# Patient Record
Sex: Female | Born: 1990 | Race: White | Hispanic: No | Marital: Single | Smoking: Never smoker
Health system: Southern US, Community
[De-identification: ages and names within clinical notes are randomized; demographics above are authoritative.]

## PROBLEM LIST (undated history)

## (undated) ENCOUNTER — Emergency Department (HOSPITAL_COMMUNITY): Admission: EM | Payer: Self-pay | Source: Home / Self Care

## (undated) DIAGNOSIS — F419 Anxiety disorder, unspecified: Secondary | ICD-10-CM

## (undated) DIAGNOSIS — F410 Panic disorder [episodic paroxysmal anxiety] without agoraphobia: Secondary | ICD-10-CM

## (undated) HISTORY — PX: WISDOM TOOTH EXTRACTION: SHX21

## (undated) HISTORY — PX: TONSILLECTOMY: SUR1361

---
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
## 2008-07-18 ENCOUNTER — Other Ambulatory Visit: Admission: RE | Admit: 2008-07-18 | Discharge: 2008-07-18 | Payer: Self-pay | Admitting: Family Medicine

## 2009-10-19 ENCOUNTER — Other Ambulatory Visit: Admission: RE | Admit: 2009-10-19 | Discharge: 2009-10-19 | Payer: Self-pay | Admitting: Family Medicine

## 2009-11-01 ENCOUNTER — Emergency Department (HOSPITAL_COMMUNITY): Admission: EM | Admit: 2009-11-01 | Discharge: 2009-11-01 | Payer: Self-pay | Admitting: Emergency Medicine

## 2010-08-05 LAB — URINALYSIS, ROUTINE W REFLEX MICROSCOPIC
Glucose, UA: NEGATIVE mg/dL
Nitrite: NEGATIVE
Protein, ur: NEGATIVE mg/dL
Urobilinogen, UA: 0.2 mg/dL (ref 0.0–1.0)
pH: 5.5 (ref 5.0–8.0)

## 2010-08-05 LAB — BASIC METABOLIC PANEL

## 2010-08-05 LAB — URINE MICROSCOPIC-ADD ON

## 2010-08-05 LAB — POCT PREGNANCY, URINE: Preg Test, Ur: NEGATIVE

## 2010-10-02 NOTE — Consult Note (Signed)
NAMECAROLIN, Wendy [name]               ACCOUNT NO.:  [id number]   MEDICAL RECORD NO.:  [id number]           PATIENT TYPE:   LOCATION:                                 FACILITY:   PHYSICIAN:  Kristine Garbe. Ezzard Standing, M.D.DATE OF BIRTH:  06/22/1990   DATE OF CONSULTATION:  11/27/2007  DATE OF DISCHARGE:                                 CONSULTATION   REASON FOR CONSULT:  Evaluate the patient with right peritonsillar  abscess.   BRIEF HISTORY:  Wendy [name] is a 20 year old female who has had  history of tonsil problems in the past.  More recently, has had positive  Strep test, was treated with clarithromycin as she is allergic to  penicillin.  She had increasing pain especially on the right side,  presents back to emergency room with what appears to be a right  peritonsillar abscess.  She had some difficulty with p.o. intake and  severe pain in the right side of her neck and throat.  On examination,  she has fairly obvious right peritonsillar abscess with diffuse swelling  of the right peritonsillar area.   PROCEDURE:  The throat was sprayed with topical Cetacaine.  The right  peritonsillar area was injected with 4 mL of Xylocaine with epinephrine  for local anesthetic.  An 18-gauge needle was utilized to aspirate the  abscess, and this was sent for culture.  A 15 blade was then used to  open up the abscess area which was enlarged with hemostat and drained.   IMPRESSION:  Right peritonsillar abscess and history of tonsil  infections.   RECOMMENDATIONS:  Incision and drainage of peritonsillar abscess was  performed in the emergency room.  Placed the patient on antibiotic  Cleocin 300 mg t.i.d. for 1 week.  We will have her follow up in the  office next week to review and discuss possible tonsillectomy.  Note, the patient is allergic to PENICILLIN.           ______________________________  Kristine Garbe. Ezzard Standing, M.D.     CEN/MEDQ  D:  11/27/2007  T:  11/28/2007  Job:  [id number]

## 2010-11-13 ENCOUNTER — Other Ambulatory Visit: Payer: Self-pay | Admitting: Physician Assistant

## 2010-11-13 ENCOUNTER — Other Ambulatory Visit (HOSPITAL_COMMUNITY)
Admission: RE | Admit: 2010-11-13 | Discharge: 2010-11-13 | Disposition: A | Discharge status: Home / Self Care | Payer: 59 | Source: Ambulatory Visit | Attending: Family Medicine | Admitting: Family Medicine

## 2010-11-13 DIAGNOSIS — Z124 Encounter for screening for malignant neoplasm of cervix: Secondary | ICD-10-CM | POA: Insufficient documentation

## 2011-02-11 LAB — RAPID STREP SCREEN (MED CTR MEBANE ONLY): Streptococcus, Group A Screen (Direct): POSITIVE — AB

## 2011-12-05 ENCOUNTER — Emergency Department (HOSPITAL_BASED_OUTPATIENT_CLINIC_OR_DEPARTMENT_OTHER)
Admission: EM | Admit: 2011-12-05 | Discharge: 2011-12-05 | Disposition: A | Discharge status: Home / Self Care | Payer: 59 | Attending: Emergency Medicine | Admitting: Emergency Medicine

## 2011-12-05 ENCOUNTER — Emergency Department (HOSPITAL_BASED_OUTPATIENT_CLINIC_OR_DEPARTMENT_OTHER): Payer: 59

## 2011-12-05 ENCOUNTER — Encounter (HOSPITAL_BASED_OUTPATIENT_CLINIC_OR_DEPARTMENT_OTHER): Payer: Self-pay | Admitting: *Deleted

## 2011-12-05 DIAGNOSIS — S99919A Unspecified injury of unspecified ankle, initial encounter: Secondary | ICD-10-CM

## 2011-12-05 DIAGNOSIS — M25579 Pain in unspecified ankle and joints of unspecified foot: Secondary | ICD-10-CM | POA: Insufficient documentation

## 2011-12-05 DIAGNOSIS — IMO0001 Reserved for inherently not codable concepts without codable children: Secondary | ICD-10-CM

## 2011-12-05 MED ORDER — NAPROXEN 250 MG PO TABS
500.0000 mg | ORAL_TABLET | Freq: Once | ORAL | Status: AC
  Administered 2011-12-05: 500 mg via ORAL
  Filled 2011-12-05: qty 2

## 2011-12-05 NOTE — ED Provider Notes (Signed)
History     CSN: [id number]  Arrival date & time 12/05/11  1252   First MD Initiated Contact with Patient 12/05/11 1259      Chief Complaint  Patient presents with  . Ankle Pain    (Consider location/radiation/quality/duration/timing/severity/associated sxs/prior treatment) HPI Comments: Patient here with left ankle pain s/p falling on Sunday evening. She was walking outside, felt her ankle lock up, heard a crack, twisted her ankle and fell. She got up and walked right away, but had to limp. She has been limping ever since. Pain is not changed since Sunday. Walking and movement make the pain worse, has not tried any alleviating factors besides some ice. Ankle swelled up the next day, bruising began yesterday. Rates pain as sharp and 8/10. Has never injured this ankle before.   Patient is a 21 y.o. female presenting with ankle pain. The history is provided by the patient and a relative.  Ankle Pain  Pertinent negatives include no numbness.    History reviewed. No pertinent past medical history.  Past Surgical History  Procedure Date  . Tonsillectomy     No family history on file.  History  Substance Use Topics  . Smoking status: Never Smoker   . Smokeless tobacco: Not on file  . Alcohol Use: No    OB History    Grav Para Term Preterm Abortions TAB SAB Ect Mult Living                  Review of Systems  Musculoskeletal: Positive for joint swelling.       Left ankle pain  Skin: Positive for color change.  Neurological: Negative for numbness.    Allergies  Review of patient's allergies indicates no known allergies.  Home Medications   Current Outpatient Rx  Name Route Sig Dispense Refill  . QUETIAPINE FUMARATE 50 MG PO TABS Oral Take 50 mg by mouth at bedtime.      BP 100/79  Pulse 99  Temp 98.3 F (36.8 C) (Oral)  Resp 20  SpO2 100%  Physical Exam  Nursing note and vitals reviewed. Constitutional: She appears well-developed and well-nourished. No  distress.  HENT:  Head: Normocephalic and atraumatic.  Eyes: Conjunctivae are normal.  Cardiovascular: Normal rate, regular rhythm and intact distal pulses.        Normal capillary refill  Pulmonary/Chest: Effort normal and breath sounds normal.  Musculoskeletal:       Left ankle: She exhibits decreased range of motion, swelling and ecchymosis (medially). She exhibits no deformity. tenderness (generalized tenderness throughout ankle, most prominent on medial aspect near navicular). No head of 5th metatarsal and no proximal fibula tenderness found. Achilles tendon normal.  Neurological: No sensory deficit.    ED Course  Procedures (including critical care time)  Labs Reviewed - No data to display Dg Ankle Complete Left  12/05/2011  *RADIOLOGY REPORT*  Clinical Data: Left ankle pain and bruising.  LEFT ANKLE COMPLETE - 3+ VIEW  Comparison: Left foot 10/08/2007.  Findings: No definite acute osseous or joint abnormality.  There is mild irregularity along the medial aspect of the tarsal navicular which, in the absence of trauma, is likely developmental.  IMPRESSION:  1.  No definite acute fracture. 2.  Slight irregularity along the medial aspect of the tarsal navicular may be developmental in the absence of trauma. Correlation for point tenderness may be clinically helpful.  Original Report Authenticated By: Reyes Ivan, M.D.     No diagnosis found. Dx- ankle pain,  probable navicular fx   MDM  21 y/o with ankle pain s/p fall on Sunday. Positive edema, medial ecchymosis, decreased ROM. Xray not showing definite fracture, but with abnormality of medial aspect of navicular. Correlation of xray findings and physical exam findings give concern for navicular fx. Will apply cam walker, give crutches, and have her f/u with Dr. Pearletha Forge early next week.        Trevor Mace, PA-C 12/05/11 [phone]

## 2011-12-05 NOTE — ED Notes (Signed)
Patient transported to X-ray 

## 2011-12-05 NOTE — ED Notes (Signed)
Foot locked up and she fell 4 days ago. Now her left ankle is swollen, painful and bruised.

## 2011-12-08 NOTE — ED Provider Notes (Signed)
Medical screening examination/treatment/procedure(s) were performed by non-physician practitioner and as supervising physician I was immediately available for consultation/collaboration.   Forbes Cellar, MD 12/08/11 1301

## 2012-03-11 ENCOUNTER — Emergency Department (HOSPITAL_BASED_OUTPATIENT_CLINIC_OR_DEPARTMENT_OTHER)
Admission: EM | Admit: 2012-03-11 | Discharge: 2012-03-11 | Disposition: A | Discharge status: Home / Self Care | Payer: 59 | Attending: Emergency Medicine | Admitting: Emergency Medicine

## 2012-03-11 ENCOUNTER — Encounter (HOSPITAL_BASED_OUTPATIENT_CLINIC_OR_DEPARTMENT_OTHER): Payer: Self-pay

## 2012-03-11 DIAGNOSIS — J069 Acute upper respiratory infection, unspecified: Secondary | ICD-10-CM | POA: Insufficient documentation

## 2012-03-11 DIAGNOSIS — J029 Acute pharyngitis, unspecified: Secondary | ICD-10-CM | POA: Insufficient documentation

## 2012-03-11 DIAGNOSIS — IMO0001 Reserved for inherently not codable concepts without codable children: Secondary | ICD-10-CM | POA: Insufficient documentation

## 2012-03-11 DIAGNOSIS — R05 Cough: Secondary | ICD-10-CM | POA: Insufficient documentation

## 2012-03-11 DIAGNOSIS — R509 Fever, unspecified: Secondary | ICD-10-CM | POA: Insufficient documentation

## 2012-03-11 DIAGNOSIS — J3489 Other specified disorders of nose and nasal sinuses: Secondary | ICD-10-CM | POA: Insufficient documentation

## 2012-03-11 DIAGNOSIS — R059 Cough, unspecified: Secondary | ICD-10-CM | POA: Insufficient documentation

## 2012-03-11 DIAGNOSIS — R000 Tachycardia, unspecified: Secondary | ICD-10-CM | POA: Insufficient documentation

## 2012-03-11 MED ORDER — IBUPROFEN 800 MG PO TABS
800.0000 mg | ORAL_TABLET | Freq: Once | ORAL | Status: AC
  Administered 2012-03-11: 800 mg via ORAL
  Filled 2012-03-11: qty 1

## 2012-03-11 MED ORDER — LIDOCAINE VISCOUS 2 % MT SOLN: 10.0000 mL | Freq: Four times a day (QID) | OROMUCOSAL | Status: DC | PRN

## 2012-03-11 MED ORDER — LIDOCAINE VISCOUS 2 % MT SOLN
20.0000 mL | Freq: Once | OROMUCOSAL | Status: AC
  Administered 2012-03-11: 20 mL via OROMUCOSAL
  Filled 2012-03-11: qty 15

## 2012-03-11 MED ORDER — DEXAMETHASONE 4 MG PO TABS
10.0000 mg | ORAL_TABLET | Freq: Once | ORAL | Status: AC
  Administered 2012-03-11: 10 mg via ORAL
  Filled 2012-03-11: qty 3

## 2012-03-11 NOTE — ED Notes (Signed)
C/o fever, cough, sneezing, sore throat,  body aches

## 2012-03-11 NOTE — ED Provider Notes (Signed)
History     CSN: [id number]  Arrival date & time 03/11/12  1601   First MD Initiated Contact with Patient 03/11/12 1613      Chief Complaint  Patient presents with  . URI     HPI  The patient presents with 3 days of complaints.  Symptoms began gradually.  Since onset symptoms have been progressive.  She complains of generalized sense of discomfort, sore throat, ongoing cough and rhinorrhea as well as subjective fever.  She notes no attempts at relief with medication thus far.  No clear exacerbating factors.  She states that prior to the onset of symptoms she was in her usual state of health. Since onset there has been no confusion, disorientation and no weakness, no ataxia, no vomiting, no diarrhea, no abdominal pain.   History reviewed. No pertinent past medical history.  Past Surgical History  Procedure Date  . Tonsillectomy   . Wisdom tooth extraction     No family history on file.  History  Substance Use Topics  . Smoking status: Never Smoker   . Smokeless tobacco: Not on file  . Alcohol Use: Yes    OB History    Grav Para Term Preterm Abortions TAB SAB Ect Mult Living                  Review of Systems  Constitutional:       Per HPI, otherwise negative  HENT:       Per HPI, otherwise negative  Eyes: Negative.   Respiratory:       Per HPI, otherwise negative  Cardiovascular:       Per HPI, otherwise negative  Gastrointestinal: Negative for vomiting.  Genitourinary: Negative.   Musculoskeletal:       Per HPI, otherwise negative  Skin: Negative.   Neurological: Negative for syncope.    Allergies  Penicillins  Home Medications   Current Outpatient Rx  Name Route Sig Dispense Refill  . LIDOCAINE VISCOUS 2 % MT SOLN Oral Take 10 mLs by mouth every 6 (six) hours as needed (sore throat). 100 mL 0  . QUETIAPINE FUMARATE 50 MG PO TABS Oral Take 50 mg by mouth at bedtime.      BP 127/84  Pulse 106  Temp 98.9 F (37.2 C) (Oral)  Resp 16  Ht 5\' 2"   (1.575 m)  Wt 182 lb 1.6 oz (82.6 kg)  BMI 33.31 kg/m2  SpO2 100%  Physical Exam  Nursing note and vitals reviewed. Constitutional: She is oriented to person, place, and time. She appears well-developed and well-nourished. No distress.  HENT:  Head: Normocephalic and atraumatic.  Nose: Nose normal.  Mouth/Throat: Uvula is midline, oropharynx is clear and moist and mucous membranes are normal. No oropharyngeal exudate.  Eyes: Conjunctivae normal and EOM are normal.  Cardiovascular: Regular rhythm.  Tachycardia present.   Pulmonary/Chest: Effort normal and breath sounds normal. No stridor. No respiratory distress. She has no decreased breath sounds. She has no wheezes. She has no rhonchi.       Ongoing cough  Abdominal: She exhibits no distension.  Musculoskeletal: She exhibits no edema.  Neurological: She is alert and oriented to person, place, and time. No cranial nerve deficit.  Skin: Skin is warm and dry.  Psychiatric: She has a normal mood and affect.    ED Course  Procedures (including critical care time)  Labs Reviewed - No data to display No results found.   1. URI (upper respiratory infection)  MDM  This generally well female presents with ongoing cough, rhinorrhea, generalized discomfort and sore throat.  The patient is afebrile, and aside from mild tachycardia has no abnormal vital signs.  The absence of distress, fever is suggestive of upper respiratory infection.  Absent hypoxia, tachypnea, abnormal lung sounds are low suspicion for pneumonia.  The patient has not taken any medication thus far for symptom control.  We discussed the need for close outpatient monitoring, and she was provided return precautions prior to being discharged.  Absent acute findings, concerning features, comorbidities, there is low suspicion for acute ongoing illness.      Gerhard Munch, MD 03/11/12 1651

## 2012-03-14 ENCOUNTER — Emergency Department (HOSPITAL_BASED_OUTPATIENT_CLINIC_OR_DEPARTMENT_OTHER)
Admission: EM | Admit: 2012-03-14 | Discharge: 2012-03-14 | Disposition: A | Discharge status: Home / Self Care | Payer: 59 | Attending: Emergency Medicine | Admitting: Emergency Medicine

## 2012-03-14 ENCOUNTER — Encounter (HOSPITAL_BASED_OUTPATIENT_CLINIC_OR_DEPARTMENT_OTHER): Payer: Self-pay | Admitting: Student

## 2012-03-14 DIAGNOSIS — J329 Chronic sinusitis, unspecified: Secondary | ICD-10-CM | POA: Insufficient documentation

## 2012-03-14 DIAGNOSIS — IMO0001 Reserved for inherently not codable concepts without codable children: Secondary | ICD-10-CM | POA: Insufficient documentation

## 2012-03-14 DIAGNOSIS — J069 Acute upper respiratory infection, unspecified: Secondary | ICD-10-CM | POA: Insufficient documentation

## 2012-03-14 DIAGNOSIS — J3489 Other specified disorders of nose and nasal sinuses: Secondary | ICD-10-CM | POA: Insufficient documentation

## 2012-03-14 MED ORDER — AZITHROMYCIN 250 MG PO TABS: 250.0000 mg | ORAL_TABLET | Freq: Every day | ORAL | Status: DC

## 2012-03-14 NOTE — ED Provider Notes (Signed)
History     CSN: [id number]  Arrival date & time 03/14/12  1729   First MD Initiated Contact with Patient 03/14/12 1833      Chief Complaint  Patient presents with  . Cough  . URI  . Nasal Congestion    (Consider location/radiation/quality/duration/timing/severity/associated sxs/prior treatment) Patient is a 21 y.o. female presenting with cough and URI. The history is provided by the patient.  Cough This is a new problem. The current episode started more than 2 days ago. The problem occurs constantly. The problem has been gradually worsening. The cough is non-productive. There has been no fever. Associated symptoms include rhinorrhea, sore throat and myalgias. Pertinent negatives include no chest pain. Associated symptoms comments: Symptoms of URI for nearly one week. Seen here on 10-23 for same and she reports now having chills and worsening congestion despite symptomatic treatment. . She has tried decongestants for the symptoms.  URI The primary symptoms include sore throat, cough and myalgias. Primary symptoms do not include abdominal pain.  Symptoms associated with the illness include sinus pressure, congestion and rhinorrhea.    History reviewed. No pertinent past medical history.  Past Surgical History  Procedure Date  . Tonsillectomy   . Wisdom tooth extraction     History reviewed. No pertinent family history.  History  Substance Use Topics  . Smoking status: Never Smoker   . Smokeless tobacco: Not on file  . Alcohol Use: Yes    OB History    Grav Para Term Preterm Abortions TAB SAB Ect Mult Living                  Review of Systems  HENT: Positive for congestion, sore throat, rhinorrhea and sinus pressure.   Respiratory: Positive for cough.   Cardiovascular: Negative for chest pain.  Gastrointestinal: Negative for abdominal pain.  Musculoskeletal: Positive for myalgias.    Allergies  Penicillins  Home Medications   Current Outpatient Rx  Name  Route Sig Dispense Refill  . MEDROXYPROGESTERONE ACETATE 150 MG/ML IM SUSP Intramuscular Inject 150 mg into the muscle every 3 (three) months.      BP 121/76  Pulse 98  Temp 98.7 F (37.1 C) (Oral)  Resp 18  Wt 180 lb (81.647 kg)  SpO2 100%  Physical Exam  Constitutional: She appears well-developed and well-nourished.  HENT:  Head: Normocephalic.  Right Ear: External ear normal.  Left Ear: External ear normal.  Nose: Mucosal edema present. Right sinus exhibits frontal sinus tenderness. Left sinus exhibits frontal sinus tenderness.  Mouth/Throat: Oropharynx is clear and moist.  Neck: Normal range of motion. Neck supple.  Cardiovascular: Normal rate and normal heart sounds.   No murmur heard. Pulmonary/Chest: Effort normal and breath sounds normal. She has no wheezes. She has no rales.  Abdominal: Soft. Bowel sounds are normal. She exhibits no distension. There is no tenderness.  Musculoskeletal: Normal range of motion.  Lymphadenopathy:    She has no cervical adenopathy.  Skin: Skin is warm and dry. No pallor.    ED Course  Procedures (including critical care time)  Labs Reviewed - No data to display No results found.   No diagnosis found.  1. Sinusitis   MDM  Patient with worsening symptoms and second visit to ER. Will treat with Z-pack and encouraged continued supportive care.        Rodena Medin, PA-C 03/14/12 1904

## 2012-03-14 NOTE — ED Provider Notes (Signed)
Medical screening examination/treatment/procedure(s) were performed by non-physician practitioner and as supervising physician I was immediately available for consultation/collaboration.   Gwyneth Sprout, MD 03/14/12 [phone]

## 2012-03-14 NOTE — ED Notes (Signed)
Cold symptoms with congestion and cough x 1 week

## 2013-02-28 ENCOUNTER — Encounter (HOSPITAL_BASED_OUTPATIENT_CLINIC_OR_DEPARTMENT_OTHER): Payer: Self-pay | Admitting: Emergency Medicine

## 2013-02-28 ENCOUNTER — Emergency Department (HOSPITAL_BASED_OUTPATIENT_CLINIC_OR_DEPARTMENT_OTHER)
Admission: EM | Admit: 2013-02-28 | Discharge: 2013-03-01 | Disposition: A | Discharge status: Home / Self Care | Payer: 59 | Attending: Emergency Medicine | Admitting: Emergency Medicine

## 2013-02-28 DIAGNOSIS — R197 Diarrhea, unspecified: Secondary | ICD-10-CM | POA: Insufficient documentation

## 2013-02-28 DIAGNOSIS — Z3202 Encounter for pregnancy test, result negative: Secondary | ICD-10-CM | POA: Insufficient documentation

## 2013-02-28 DIAGNOSIS — Z792 Long term (current) use of antibiotics: Secondary | ICD-10-CM | POA: Insufficient documentation

## 2013-02-28 DIAGNOSIS — Z880 Allergy status to penicillin: Secondary | ICD-10-CM | POA: Insufficient documentation

## 2013-02-28 DIAGNOSIS — R112 Nausea with vomiting, unspecified: Secondary | ICD-10-CM

## 2013-02-28 LAB — URINALYSIS, ROUTINE W REFLEX MICROSCOPIC: Ketones, ur: 15 mg/dL — AB

## 2013-02-28 LAB — URINE MICROSCOPIC-ADD ON

## 2013-02-28 MED ORDER — ONDANSETRON HCL 4 MG/2ML IJ SOLN
4.0000 mg | Freq: Once | INTRAMUSCULAR | Status: AC
  Administered 2013-02-28: 4 mg via INTRAVENOUS
  Filled 2013-02-28: qty 2

## 2013-02-28 MED ORDER — ONDANSETRON HCL 4 MG PO TABS: 4.0000 mg | ORAL_TABLET | Freq: Four times a day (QID) | ORAL | Status: DC

## 2013-02-28 MED ORDER — SODIUM CHLORIDE 0.9 % IV BOLUS (SEPSIS)
1000.0000 mL | Freq: Once | INTRAVENOUS | Status: AC
  Administered 2013-02-28: 1000 mL via INTRAVENOUS

## 2013-02-28 NOTE — ED Notes (Signed)
C/o vomiting and diarrhea x 2 days, pt states she feels like she has had a fever.

## 2013-02-28 NOTE — ED Provider Notes (Signed)
CSN: [id number]     Arrival date & time 02/28/13  2202 History   First MD Initiated Contact with Patient 02/28/13 2222     Chief Complaint  Patient presents with  . Emesis  . Diarrhea   (Consider location/radiation/quality/duration/timing/severity/associated sxs/prior Treatment) Patient is a 22 y.o. female presenting with vomiting and diarrhea. The history is provided by the patient. No language interpreter was used.  Emesis Severity:  Moderate Associated symptoms: diarrhea   Associated symptoms: no chills   Associated symptoms comment:  N, V, D that started 2 days ago. She has "felt warm". No bloody emesis or stool. She denies sick contacts. She has been able to tolerate water but has little appetite. She continues to produce dark urine, but denies dysuria. Diarrhea Associated symptoms: vomiting   Associated symptoms: no chills and no fever     History reviewed. No pertinent past medical history. Past Surgical History  Procedure Laterality Date  . Tonsillectomy    . Wisdom tooth extraction     History reviewed. No pertinent family history. History  Substance Use Topics  . Smoking status: Never Smoker   . Smokeless tobacco: Not on file  . Alcohol Use: Yes   OB History   Grav Para Term Preterm Abortions TAB SAB Ect Mult Living                 Review of Systems  Constitutional: Negative for fever and chills.  HENT: Negative.   Respiratory: Negative.   Cardiovascular: Negative.   Gastrointestinal: Positive for nausea, vomiting and diarrhea.  Genitourinary: Negative for dysuria.  Musculoskeletal: Negative.   Skin: Negative.   Neurological: Negative.     Allergies  Penicillins  Home Medications   Current Outpatient Rx  Name  Route  Sig  Dispense  Refill  . medroxyPROGESTERone (DEPO-PROVERA) 150 MG/ML injection   Intramuscular   Inject 150 mg into the muscle every 3 (three) months.         Marland Kitchen azithromycin (ZITHROMAX) 250 MG tablet   Oral   Take 1 tablet (250  mg total) by mouth daily. Take first 2 tablets together, then 1 every day until finished.   6 tablet   0    BP 128/80  Pulse 89  Temp(Src) 98.8 F (37.1 C) (Oral)  Resp 18  Ht 5' (1.524 m)  SpO2 98%  LMP 02/21/2013 Physical Exam  Constitutional: She is oriented to person, place, and time. She appears well-developed and well-nourished.  HENT:  Head: Normocephalic.  Neck: Normal range of motion. Neck supple.  Cardiovascular: Normal rate and regular rhythm.   Pulmonary/Chest: Effort normal and breath sounds normal.  Abdominal: Soft. Bowel sounds are normal. There is tenderness. There is no rebound and no guarding.  Generalized tenderness without guarding or rebound.   Musculoskeletal: Normal range of motion.  Neurological: She is alert and oriented to person, place, and time.  Skin: Skin is warm and dry. No rash noted.  Psychiatric: She has a normal mood and affect.    ED Course  Procedures (including critical care time) Labs Review Labs Reviewed  URINALYSIS, ROUTINE W REFLEX MICROSCOPIC - Abnormal; Notable for the following:    Color, Urine AMBER (*)    APPearance CLOUDY (*)    Specific Gravity, Urine 1.036 (*)    Bilirubin Urine SMALL (*)    Ketones, ur 15 (*)    Protein, ur 30 (*)    Leukocytes, UA SMALL (*)    All other components within normal limits  URINE MICROSCOPIC-ADD ON - Abnormal; Notable for the following:    Squamous Epithelial / LPF FEW (*)    Bacteria, UA FEW (*)    All other components within normal limits  URINE CULTURE  PREGNANCY, URINE   Imaging Review No results found.  EKG Interpretation   None       MDM  No diagnosis found. 1. N, V, D 2. Dehydration  She is feeling improved with IV fluids. No vomiting in ED. She has not had any further bowel movements. Tolerating PO fluids.     Arnoldo Hooker, PA-C 02/28/13 2348

## 2013-03-01 NOTE — ED Notes (Signed)
PO fluids given

## 2013-03-02 LAB — URINE CULTURE

## 2013-03-03 NOTE — ED Notes (Signed)
+   Urine No treatment needed at this time

## 2013-03-03 NOTE — Progress Notes (Signed)
ED Antimicrobial Stewardship Positive Culture Follow Up   Wendy [name] is an 22 y.o. female who presented to [facility] on 02/28/2013 with a chief complaint of  Chief Complaint  Patient presents with  . Emesis  . Diarrhea    Recent Results (from the past 720 hour(s))  URINE CULTURE     Status: None   Collection Time    02/28/13 10:18 PM      Result Value Range Status   Specimen Description URINE, CLEAN CATCH   Final   Special Requests NONE   Final   Culture  Setup Time     Final   Value: 03/01/2013 03:37     Performed at [facility] Count     Final   Value: 20,OOO COLONIES/ML     Performed at [facility]   Culture     Final   Value: GROUP B STREP(S.AGALACTIAE)ISOLATED     Note: TESTING AGAINST S. AGALACTIAE NOT ROUTINELY PERFORMED DUE TO PREDICTABILITY OF AMP/PEN/VAN SUSCEPTIBILITY.     Performed at [facility]   Report Status 03/02/2013 FINAL   Final    22yof presented with N/V and diarrhea. No urinary symptoms or fever reported. Asymptomatic bacteriuria - no treatment indicated at this time.     ED Provider: Johnnette Gourd, PA-C   Cleon Dew 03/03/2013, 4:55 PM Infectious Diseases Pharmacist Phone# [phone]

## 2013-03-03 NOTE — ED Provider Notes (Signed)
Medical screening examination/treatment/procedure(s) were performed by non-physician practitioner and as supervising physician I was immediately available for consultation/collaboration.    Deshara Rossi J. Nabiha Planck, MD 03/03/13 1013 

## 2014-05-27 ENCOUNTER — Emergency Department (HOSPITAL_COMMUNITY)
Admission: EM | Admit: 2014-05-27 | Discharge: 2014-05-27 | Disposition: A | Discharge status: Home / Self Care | Payer: Self-pay | Attending: Emergency Medicine | Admitting: Emergency Medicine

## 2014-05-27 ENCOUNTER — Encounter (HOSPITAL_COMMUNITY): Payer: Self-pay | Admitting: Emergency Medicine

## 2014-05-27 DIAGNOSIS — H1032 Unspecified acute conjunctivitis, left eye: Secondary | ICD-10-CM | POA: Insufficient documentation

## 2014-05-27 DIAGNOSIS — Z79899 Other long term (current) drug therapy: Secondary | ICD-10-CM | POA: Insufficient documentation

## 2014-05-27 DIAGNOSIS — H109 Unspecified conjunctivitis: Secondary | ICD-10-CM

## 2014-05-27 MED ORDER — ERYTHROMYCIN 5 MG/GM OP OINT
TOPICAL_OINTMENT | Freq: Four times a day (QID) | OPHTHALMIC | Status: DC
  Administered 2014-05-27: 1 via OPHTHALMIC
  Filled 2014-05-27: qty 3.5

## 2014-05-27 MED ORDER — FLUORESCEIN SODIUM 1 MG OP STRP
1.0000 | ORAL_STRIP | Freq: Once | OPHTHALMIC | Status: AC
  Administered 2014-05-27: 1 via OPHTHALMIC
  Filled 2014-05-27: qty 1

## 2014-05-27 MED ORDER — TETRACAINE HCL 0.5 % OP SOLN
2.0000 [drp] | Freq: Once | OPHTHALMIC | Status: AC
  Administered 2014-05-27: 2 [drp] via OPHTHALMIC
  Filled 2014-05-27: qty 2

## 2014-05-27 MED ORDER — IBUPROFEN 800 MG PO TABS
800.0000 mg | ORAL_TABLET | Freq: Once | ORAL | Status: AC
  Administered 2014-05-27: 800 mg via ORAL
  Filled 2014-05-27: qty 1

## 2014-05-27 NOTE — ED Notes (Signed)
Used some of mothers new eye make up, noticed it was bothering her eye and began scratching it. The next day she woke up to a red, swollen, itching, painful, watery left eye yesterday.

## 2014-05-27 NOTE — ED Notes (Signed)
Eye kit/light with medications ready in the room

## 2014-05-27 NOTE — ED Provider Notes (Signed)
CSN: [id number]     Arrival date & time 05/27/14  1131 History  This chart was scribed for non-physician practitioner, Jinny Sanders, PA-C, working with No att. providers found by Angelene Giovanni, ED Scribe. The patient was seen in room WTR7/WTR7 and the patient's care was started at 1:06 PM    Chief Complaint  Patient presents with  . Eye Problem   The history is provided by the patient. No language interpreter was used.   HPI Comments: Wendy [name] is a 24 y.o. female who presents to the Emergency Department complaining of a gradually worsening left eye problem. She explains that she put on her mom's eye make up 2 days ago and woke up the next day with eye problems. She reports associated redness, swelling, and a watery left eye. She reports trying to clean the eye with a salt solution 2 days ago with no problems but the eye was burning today when she tried the solution. She denies change in visual acuity, blurred vision, loss of vision, dizziness, headache, floaters, trauma to her eye. She denies the feeling of a foreign body.  History reviewed. No pertinent past medical history. Past Surgical History  Procedure Laterality Date  . Tonsillectomy    . Wisdom tooth extraction     History reviewed. No pertinent family history. History  Substance Use Topics  . Smoking status: Never Smoker   . Smokeless tobacco: Not on file  . Alcohol Use: Yes   OB History    No data available     Review of Systems  Constitutional: Negative for fever.  Eyes: Positive for discharge, redness and itching. Negative for photophobia and visual disturbance.      Allergies  Penicillins  Home Medications   Prior to Admission medications   Medication Sig Start Date End Date Taking? Authorizing Provider  azithromycin (ZITHROMAX) 250 MG tablet Take 1 tablet (250 mg total) by mouth daily. Take first 2 tablets together, then 1 every day until finished. 03/14/12   Shari A Upstill, PA-C   medroxyPROGESTERone (DEPO-PROVERA) 150 MG/ML injection Inject 150 mg into the muscle every 3 (three) months.    Historical Provider, MD  ondansetron (ZOFRAN) 4 MG tablet Take 1 tablet (4 mg total) by mouth every 6 (six) hours. 02/28/13   Shari A Upstill, PA-C   BP 110/59 mmHg  Pulse 96  Temp(Src) 98.2 F (36.8 C) (Oral)  Resp 20  SpO2 97% Physical Exam  Constitutional: She is oriented to person, place, and time. She appears well-developed and well-nourished. No distress.  HENT:  Head: Normocephalic and atraumatic.  Eyes: EOM are normal. Pupils are equal, round, and reactive to light. Right eye exhibits no chemosis. Left eye exhibits discharge. Left eye exhibits no chemosis, no exudate and no hordeolum. No foreign body present in the left eye. Left conjunctiva is injected. Left conjunctiva has no hemorrhage. No scleral icterus. Right eye exhibits normal extraocular motion and no nystagmus. Left eye exhibits normal extraocular motion and no nystagmus.  Slit lamp exam:      The right eye shows no anterior chamber bulge.       The left eye shows no corneal abrasion, no corneal flare, no corneal ulcer, no foreign body, no hyphema, no hypopyon, no fluorescein uptake and no anterior chamber bulge.  Mild erythema to bulbar and palpebral conjunctiva  No consensual photophobia.   Neck: Neck supple. No tracheal deviation present.  Cardiovascular: Normal rate.   Pulmonary/Chest: Effort normal. No respiratory distress.  Musculoskeletal: Normal  range of motion.  Neurological: She is alert and oriented to person, place, and time.  Skin: Skin is warm and dry.  Psychiatric: She has a normal mood and affect. Her behavior is normal.  Nursing note and vitals reviewed.   ED Course  Procedures (including critical care time) DIAGNOSTIC STUDIES: Oxygen Saturation is 100% on RA, normal by my interpretation.    COORDINATION OF CARE: 1:11 PM- Pt advised of plan for treatment and pt agrees.    Labs  Review Labs Reviewed - No data to display  Imaging Review No results found.   EKG Interpretation None      MDM   Final diagnoses:  Conjunctivitis of left eye    Bacterial conjunctivitis  Patient presentation consistent with bacterial conjunctivitis.  Mild purulent discharge. No corneal abrasions, entrapment, consensual photophobia, or dendritic staining with fluorescein study.  Presentation non-concerning for iritis, corneal abrasions, or HSV.  Antibiotics are indicated and patient will be prescribed erythromycin ointment.  Personal hygiene and frequent handwashing discussed.  Patient advised to followup with ophthalmologist if symptoms persist or worsen in any way including vision change or purulent discharge.  Patient verbalizes understanding and is agreeable with discharge. I encouraged patient to call or return to the ER should she have any questions or concerns.  I personally performed the services described in this documentation, which was scribed in my presence. The recorded information has been reviewed and is accurate.   BP 110/59 mmHg  Pulse 96  Temp(Src) 98.2 F (36.8 C) (Oral)  Resp 20  SpO2 97%  Signed,  Ladona MowJoe Bernadette Gores, PA-C 10:24 PM    Monte FantasiaJoseph W Kawanna Christley, PA-C 05/27/14 2224  Toy CookeyMegan Docherty, MD 05/31/14 [phone]

## 2014-05-27 NOTE — Discharge Instructions (Signed)
Follow-up with your ophthalmologist in 3-5 days if your symptoms are not improving. Return to the ER if you develop any vision problems, worsening of symptoms, severe pain in your eye or with movement of your eye, or high fever.  Bacterial Conjunctivitis Bacterial conjunctivitis, commonly called pink eye, is an inflammation of the clear membrane that covers the white part of the eye (conjunctiva). The inflammation can also happen on the underside of the eyelids. The blood vessels in the conjunctiva become inflamed, causing the eye to become red or pink. Bacterial conjunctivitis may spread easily from one eye to another and from person to person (contagious).  CAUSES  Bacterial conjunctivitis is caused by bacteria. The bacteria may come from your own skin, your upper respiratory tract, or from someone else with bacterial conjunctivitis. SYMPTOMS  The normally white color of the eye or the underside of the eyelid is usually pink or red. The pink eye is usually associated with irritation, tearing, and some sensitivity to light. Bacterial conjunctivitis is often associated with a thick, yellowish discharge from the eye. The discharge may turn into a crust on the eyelids overnight, which causes your eyelids to stick together. If a discharge is present, there may also be some blurred vision in the affected eye. DIAGNOSIS  Bacterial conjunctivitis is diagnosed by your caregiver through an eye exam and the symptoms that you report. Your caregiver looks for changes in the surface tissues of your eyes, which may point to the specific type of conjunctivitis. A sample of any discharge may be collected on a cotton-tip swab if you have a severe case of conjunctivitis, if your cornea is affected, or if you keep getting repeat infections that do not respond to treatment. The sample will be sent to a lab to see if the inflammation is caused by a bacterial infection and to see if the infection will respond to antibiotic  medicines. TREATMENT   Bacterial conjunctivitis is treated with antibiotics. Antibiotic eyedrops are most often used. However, antibiotic ointments are also available. Antibiotics pills are sometimes used. Artificial tears or eye washes may ease discomfort. HOME CARE INSTRUCTIONS   To ease discomfort, apply a cool, clean washcloth to your eye for 10-20 minutes, 3-4 times a day.  Gently wipe away any drainage from your eye with a warm, wet washcloth or a cotton ball.  Wash your hands often with soap and water. Use paper towels to dry your hands.  Do not share towels or washcloths. This may spread the infection.  Change or wash your pillowcase every day.  You should not use eye makeup until the infection is gone.  Do not operate machinery or drive if your vision is blurred.  Stop using contact lenses. Ask your caregiver how to sterilize or replace your contacts before using them again. This depends on the type of contact lenses that you use.  When applying medicine to the infected eye, do not touch the edge of your eyelid with the eyedrop bottle or ointment tube. SEEK IMMEDIATE MEDICAL CARE IF:   Your infection has not improved within 3 days after beginning treatment.  You had yellow discharge from your eye and it returns.  You have increased eye pain.  Your eye redness is spreading.  Your vision becomes blurred.  You have a fever or persistent symptoms for more than 2-3 days.  You have a fever and your symptoms suddenly get worse.  You have facial pain, redness, or swelling. MAKE SURE YOU:   Understand these instructions.  Will watch your condition.  Will get help right away if you are not doing well or get worse. Document Released: 05/06/2005 Document Revised: 09/20/2013 Document Reviewed: 10/07/2011 [facility] Patient Information 2015 [place], [place]. This information is not intended to replace advice given to you by your health care provider. Make sure you discuss any  questions you have with your health care provider.

## 2014-11-15 ENCOUNTER — Emergency Department (HOSPITAL_COMMUNITY): Payer: Self-pay

## 2014-11-15 ENCOUNTER — Emergency Department (HOSPITAL_COMMUNITY)
Admission: EM | Admit: 2014-11-15 | Discharge: 2014-11-15 | Disposition: A | Discharge status: Home / Self Care | Payer: Self-pay | Attending: Emergency Medicine | Admitting: Emergency Medicine

## 2014-11-15 ENCOUNTER — Encounter (HOSPITAL_COMMUNITY): Payer: Self-pay

## 2014-11-15 DIAGNOSIS — Y9389 Activity, other specified: Secondary | ICD-10-CM | POA: Insufficient documentation

## 2014-11-15 DIAGNOSIS — Y998 Other external cause status: Secondary | ICD-10-CM | POA: Insufficient documentation

## 2014-11-15 DIAGNOSIS — Z880 Allergy status to penicillin: Secondary | ICD-10-CM | POA: Insufficient documentation

## 2014-11-15 DIAGNOSIS — S93402A Sprain of unspecified ligament of left ankle, initial encounter: Secondary | ICD-10-CM | POA: Insufficient documentation

## 2014-11-15 DIAGNOSIS — W1842XA Slipping, tripping and stumbling without falling due to stepping into hole or opening, initial encounter: Secondary | ICD-10-CM | POA: Insufficient documentation

## 2014-11-15 DIAGNOSIS — Y9289 Other specified places as the place of occurrence of the external cause: Secondary | ICD-10-CM | POA: Insufficient documentation

## 2014-11-15 MED ORDER — NAPROXEN 500 MG PO TABS
500.0000 mg | ORAL_TABLET | Freq: Two times a day (BID) | ORAL | Status: DC
Start: 2014-11-15 — End: 2017-12-06

## 2014-11-15 NOTE — Discharge Instructions (Signed)
Keep ankle elevated. Ice several times a day. Naprosyn for pain and inflammation. Use crutches as needed several times a day. Follow up with primary care doctor or orthopedics specialist if pain not improving. See rehab excercises below.    Acute Ankle Sprain with Phase I Rehab An acute ankle sprain is a partial or complete tear in one or more of the ligaments of the ankle due to traumatic injury. The severity of the injury depends on both the number of ligaments sprained and the grade of sprain. There are 3 grades of sprains.   A grade 1 sprain is a mild sprain. There is a slight pull without obvious tearing. There is no loss of strength, and the muscle and ligament are the correct length.  A grade 2 sprain is a moderate sprain. There is tearing of fibers within the substance of the ligament where it connects two bones or two cartilages. The length of the ligament is increased, and there is usually decreased strength.  A grade 3 sprain is a complete rupture of the ligament and is uncommon. In addition to the grade of sprain, there are three types of ankle sprains.  Lateral ankle sprains: This is a sprain of one or more of the three ligaments on the outer side (lateral) of the ankle. These are the most common sprains. Medial ankle sprains: There is one large triangular ligament of the inner side (medial) of the ankle that is susceptible to injury. Medial ankle sprains are less common. Syndesmosis, "high ankle," sprains: The syndesmosis is the ligament that connects the two bones of the lower leg. Syndesmosis sprains usually only occur with very severe ankle sprains. SYMPTOMS  Pain, tenderness, and swelling in the ankle, starting at the side of injury that may progress to the whole ankle and foot with time.  "Pop" or tearing sensation at the time of injury.  Bruising that may spread to the heel.  Impaired ability to walk soon after injury. CAUSES   Acute ankle sprains are caused by trauma  placed on the ankle that temporarily forces or pries the anklebone (talus) out of its normal socket.  Stretching or tearing of the ligaments that normally hold the joint in place (usually due to a twisting injury). RISK INCREASES WITH:  Previous ankle sprain.  Sports in which the foot may land awkwardly (i.e., basketball, volleyball, or soccer) or walking or running on uneven or rough surfaces.  Shoes with inadequate support to prevent sideways motion when stress occurs.  Poor strength and flexibility.  Poor balance skills.  Contact sports. PREVENTION   Warm up and stretch properly before activity.  Maintain physical fitness:  Ankle and leg flexibility, muscle strength, and endurance.  Cardiovascular fitness.  Balance training activities.  Use proper technique and have a coach correct improper technique.  Taping, protective strapping, bracing, or high-top tennis shoes may help prevent injury. Initially, tape is best; however, it loses most of its support function within 10 to 15 minutes.  Wear proper-fitted protective shoes (High-top shoes with taping or bracing is more effective than either alone).  Provide the ankle with support during sports and practice activities for 12 months following injury. PROGNOSIS   If treated properly, ankle sprains can be expected to recover completely; however, the length of recovery depends on the degree of injury.  A grade 1 sprain usually heals enough in 5 to 7 days to allow modified activity and requires an average of 6 weeks to heal completely.  A grade 2 sprain  requires 6 to 10 weeks to heal completely.  A grade 3 sprain requires 12 to 16 weeks to heal.  A syndesmosis sprain often takes more than 3 months to heal. RELATED COMPLICATIONS   Frequent recurrence of symptoms may result in a chronic problem. Appropriately addressing the problem the first time decreases the frequency of recurrence and optimizes healing time. Severity of  the initial sprain does not predict the likelihood of later instability.  Injury to other structures (bone, cartilage, or tendon).  A chronically unstable or arthritic ankle joint is a possibility with repeated sprains. TREATMENT Treatment initially involves the use of ice, medication, and compression bandages to help reduce pain and inflammation. Ankle sprains are usually immobilized in a walking cast or boot to allow for healing. Crutches may be recommended to reduce pressure on the injury. After immobilization, strengthening and stretching exercises may be necessary to regain strength and a full range of motion. Surgery is rarely needed to treat ankle sprains. MEDICATION   Nonsteroidal anti-inflammatory medications, such as aspirin and ibuprofen (do not take for the first 3 days after injury or within 7 days before surgery), or other minor pain relievers, such as acetaminophen, are often recommended. Take these as directed by your caregiver. Contact your caregiver immediately if any bleeding, stomach upset, or signs of an allergic reaction occur from these medications.  Ointments applied to the skin may be helpful.  Pain relievers may be prescribed as necessary by your caregiver. Do not take prescription pain medication for longer than 4 to 7 days. Use only as directed and only as much as you need. HEAT AND COLD  Cold treatment (icing) is used to relieve pain and reduce inflammation for acute and chronic cases. Cold should be applied for 10 to 15 minutes every 2 to 3 hours for inflammation and pain and immediately after any activity that aggravates your symptoms. Use ice packs or an ice massage.  Heat treatment may be used before performing stretching and strengthening activities prescribed by your caregiver. Use a heat pack or a warm soak. SEEK IMMEDIATE MEDICAL CARE IF:   Pain, swelling, or bruising worsens despite treatment.  You experience pain, numbness, discoloration, or coldness in  the foot or toes.  New, unexplained symptoms develop (drugs used in treatment may produce side effects.) EXERCISES  PHASE I EXERCISES RANGE OF MOTION (ROM) AND STRETCHING EXERCISES - Ankle Sprain, Acute Phase I, Weeks 1 to 2 These exercises may help you when beginning to restore flexibility in your ankle. You will likely work on these exercises for the 1 to 2 weeks after your injury. Once your physician, physical therapist, or athletic trainer sees adequate progress, he or she will advance your exercises. While completing these exercises, remember:   Restoring tissue flexibility helps normal motion to return to the joints. This allows healthier, less painful movement and activity.  An effective stretch should be held for at least 30 seconds.  A stretch should never be painful. You should only feel a gentle lengthening or release in the stretched tissue. RANGE OF MOTION - Dorsi/Plantar Flexion  While sitting with your right / left knee straight, draw the top of your foot upwards by flexing your ankle. Then reverse the motion, pointing your toes downward.  Hold each position for __________ seconds.  After completing your first set of exercises, repeat this exercise with your knee bent. Repeat __________ times. Complete this exercise __________ times per day.  RANGE OF MOTION - Ankle Alphabet  Imagine your right /  left big toe is a pen.  Keeping your hip and knee still, write out the entire alphabet with your "pen." Make the letters as large as you can without increasing any discomfort. Repeat __________ times. Complete this exercise __________ times per day.  STRENGTHENING EXERCISES - Ankle Sprain, Acute -Phase I, Weeks 1 to 2 These exercises may help you when beginning to restore strength in your ankle. You will likely work on these exercises for 1 to 2 weeks after your injury. Once your physician, physical therapist, or athletic trainer sees adequate progress, he or she will advance your  exercises. While completing these exercises, remember:   Muscles can gain both the endurance and the strength needed for everyday activities through controlled exercises.  Complete these exercises as instructed by your physician, physical therapist, or athletic trainer. Progress the resistance and repetitions only as guided.  You may experience muscle soreness or fatigue, but the pain or discomfort you are trying to eliminate should never worsen during these exercises. If this pain does worsen, stop and make certain you are following the directions exactly. If the pain is still present after adjustments, discontinue the exercise until you can discuss the trouble with your clinician. STRENGTH - Dorsiflexors  Secure a rubber exercise band/tubing to a fixed object (i.e., table, pole) and loop the other end around your right / left foot.  Sit on the floor facing the fixed object. The band/tubing should be slightly tense when your foot is relaxed.  Slowly draw your foot back toward you using your ankle and toes.  Hold this position for __________ seconds. Slowly release the tension in the band and return your foot to the starting position. Repeat __________ times. Complete this exercise __________ times per day.  STRENGTH - Plantar-flexors   Sit with your right / left leg extended. Holding onto both ends of a rubber exercise band/tubing, loop it around the ball of your foot. Keep a slight tension in the band.  Slowly push your toes away from you, pointing them downward.  Hold this position for __________ seconds. Return slowly, controlling the tension in the band/tubing. Repeat __________ times. Complete this exercise __________ times per day.  STRENGTH - Ankle Eversion  Secure one end of a rubber exercise band/tubing to a fixed object (table, pole). Loop the other end around your foot just before your toes.  Place your fists between your knees. This will focus your strengthening at your  ankle.  Drawing the band/tubing across your opposite foot, slowly, pull your little toe out and up. Make sure the band/tubing is positioned to resist the entire motion.  Hold this position for __________ seconds. Have your muscles resist the band/tubing as it slowly pulls your foot back to the starting position.  Repeat __________ times. Complete this exercise __________ times per day.  STRENGTH - Ankle Inversion  Secure one end of a rubber exercise band/tubing to a fixed object (table, pole). Loop the other end around your foot just before your toes.  Place your fists between your knees. This will focus your strengthening at your ankle.  Slowly, pull your big toe up and in, making sure the band/tubing is positioned to resist the entire motion.  Hold this position for __________ seconds.  Have your muscles resist the band/tubing as it slowly pulls your foot back to the starting position. Repeat __________ times. Complete this exercises __________ times per day.  STRENGTH - Towel Curls  Sit in a chair positioned on a non-carpeted surface.  Place  your right / left foot on a towel, keeping your heel on the floor.  Pull the towel toward your heel by only curling your toes. Keep your heel on the floor.  If instructed by your physician, physical therapist, or athletic trainer, add weight to the end of the towel. Repeat __________ times. Complete this exercise __________ times per day. Document Released: 12/05/2004 Document Revised: 09/20/2013 Document Reviewed: 08/18/2008 [facility] Patient Information 2015 [place], [place]. This information is not intended to replace advice given to you by your health care provider. Make sure you discuss any questions you have with your health care provider.

## 2014-11-15 NOTE — ED Notes (Addendum)
Pt c/o L ankle injury x 3 days ago w/ re-injury x 2 days ago.  Pain score 9/10.  Pt reports she initially stepped in a hole and "turned it."  Pt reports taking ibuprofen w/o relief.

## 2014-11-15 NOTE — ED Provider Notes (Signed)
CSN: [id number]     Arrival date & time 11/15/14  1601 History  This chart was scribed for Jaynie Crumble, PA-C , working with Lorre Nick, MD by Octavia Heir, ED Scribe. This patient was seen in room WTR7/WTR7 and the patient's care was started at 6:09 PM.    Chief Complaint  Patient presents with  . Ankle Injury      The history is provided by the patient. No language interpreter was used.    HPI Comments: Wendy [name] is a 24 y.o. female who presents to the Emergency Department complaining of a left ankle injury that occurred 3 days ago. Pt notes she stepped in a hole and believes she twisted her ankle. Pt also notes being at the pool 2 days ago and injuring her ankle again. She says she took OTC ibuprofen to alleviate the pain with no relief. Pt also reports having a previous break to the same ankle 2 years ago. Pt with swelling to the ankle. Pain with movement, palpation, and walking.    History reviewed. No pertinent past medical history. Past Surgical History  Procedure Laterality Date  . Tonsillectomy    . Wisdom tooth extraction     History reviewed. No pertinent family history. History  Substance Use Topics  . Smoking status: Never Smoker   . Smokeless tobacco: Not on file  . Alcohol Use: Yes   OB History    No data available     Review of Systems  Musculoskeletal: Positive for joint swelling.  All other systems reviewed and are negative.     Allergies  Penicillins  Home Medications   Prior to Admission medications   Medication Sig Start Date End Date Taking? Authorizing Provider  azithromycin (ZITHROMAX) 250 MG tablet Take 1 tablet (250 mg total) by mouth daily. Take first 2 tablets together, then 1 every day until finished. 03/14/12   Elpidio Anis, PA-C  medroxyPROGESTERone (DEPO-PROVERA) 150 MG/ML injection Inject 150 mg into the muscle every 3 (three) months.    Historical Provider, MD  ondansetron (ZOFRAN) 4 MG tablet Take 1 tablet (4 mg  total) by mouth every 6 (six) hours. 02/28/13   Elpidio Anis, PA-C   Triage vitals: BP 123/77 mmHg  Pulse 84  Temp(Src) 98.5 F (36.9 C) (Oral)  Resp 20  SpO2 100%  LMP 10/20/2014 Physical Exam  Constitutional: She is oriented to person, place, and time. She appears well-developed and well-nourished. No distress.  HENT:  Head: Normocephalic.  Eyes: Conjunctivae are normal. Pupils are equal, round, and reactive to light. No scleral icterus.  Neck: Normal range of motion. Neck supple. No thyromegaly present.  Cardiovascular: Normal rate and regular rhythm.  Exam reveals no gallop and no friction rub.   No murmur heard. Pulmonary/Chest: Effort normal and breath sounds normal. No respiratory distress. She has no wheezes. She has no rales.  Abdominal: Soft. Bowel sounds are normal. She exhibits no distension. There is no tenderness. There is no rebound.  Musculoskeletal: Normal range of motion.  Swelling noted to the medial aspect of the left ankle. Tender palpation over medial malleolus and surrounding soft tissue. Pain with any range of motion. Dorsal pedal pulses intact and equal bilaterally. Full range of motion of all toes. No tenderness over lateral malleolus. Achilles tendon is intact. Normal knee.   Neurological: She is alert and oriented to person, place, and time.  Skin: Skin is warm and dry. No rash noted.  Psychiatric: She has a normal mood and affect. Her  behavior is normal.  Nursing note and vitals reviewed.   ED Course  Procedures  DIAGNOSTIC STUDIES: Oxygen Saturation is 100% on RA, normal by my interpretation.  COORDINATION OF CARE:  6:11 PM Discussed treatment plan which includes ice, keep leg elevated, use crutches to ambulate, and ankle brace, follow up with PCP if symptoms get worse with pt at bedside and pt agreed to plan.  Labs Review Labs Reviewed - No data to display  Imaging Review Dg Ankle Complete Left  11/15/2014   CLINICAL DATA:  Fall today with  injury of the left ankle. Prior injury of the same ankle 3 days ago.  EXAM: LEFT ANKLE COMPLETE - 3+ VIEW  COMPARISON:  None.  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  Malleoli intact. Plafond and talar dome unremarkable. Base of the fifth metatarsal appears intact.  IMPRESSION: 1. No fracture identified. 2. 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   Electronically Signed   By: Gaylyn RongWalter  Liebkemann M.D.   On: 11/15/2014 17:36     EKG Interpretation None      MDM   Final diagnoses:  Ankle sprain, left, initial encounter   Patient in the emergency department with what appears to be medial ankle sprain. X-rays negative. Patient reports breaking not ankle in the past. Will start on naproxen, crutches, follow up with orthopedic specialist as needed. Instructed to ice and elevate at home.  Filed Vitals:   11/15/14 1609  BP: 123/77  Pulse: 84  Temp: 98.5 F (36.9 C)  TempSrc: Oral  Resp: 20  SpO2: 100%   I personally performed the services described in this documentation, which was scribed in my presence. The recorded information has been reviewed and is accurate.   Jaynie Crumbleatyana Kyra Laffey, PA-C 11/15/14 Rickey [id number]  Lorre NickAnthony Allen, MD 11/15/14 [phone]

## 2014-12-02 ENCOUNTER — Emergency Department (HOSPITAL_BASED_OUTPATIENT_CLINIC_OR_DEPARTMENT_OTHER)
Admission: EM | Admit: 2014-12-02 | Discharge: 2014-12-02 | Disposition: A | Discharge status: Home / Self Care | Payer: Self-pay | Attending: Emergency Medicine | Admitting: Emergency Medicine

## 2014-12-02 ENCOUNTER — Encounter (HOSPITAL_BASED_OUTPATIENT_CLINIC_OR_DEPARTMENT_OTHER): Payer: Self-pay | Admitting: *Deleted

## 2014-12-02 DIAGNOSIS — X58XXXD Exposure to other specified factors, subsequent encounter: Secondary | ICD-10-CM | POA: Insufficient documentation

## 2014-12-02 DIAGNOSIS — S93602D Unspecified sprain of left foot, subsequent encounter: Secondary | ICD-10-CM | POA: Insufficient documentation

## 2014-12-02 DIAGNOSIS — Z880 Allergy status to penicillin: Secondary | ICD-10-CM | POA: Insufficient documentation

## 2014-12-02 DIAGNOSIS — Z791 Long term (current) use of non-steroidal anti-inflammatories (NSAID): Secondary | ICD-10-CM | POA: Insufficient documentation

## 2014-12-02 NOTE — ED Notes (Signed)
Injury to her left foot 2 weeks ago. She stepped in a hole. Has had a negative xray. Swelling continues.

## 2014-12-02 NOTE — Discharge Instructions (Signed)
Ibuprofen 600 mg every 6 hours as needed for pain.  Elevate your leg and rest.   Foot Sprain The muscles and cord like structures which attach muscle to bone (tendons) that surround the feet are made up of units. A foot sprain can occur at the weakest spot in any of these units. This condition is most often caused by injury to or overuse of the foot, as from playing contact sports, or aggravating a previous injury, or from poor conditioning, or obesity. SYMPTOMS  Pain with movement of the foot.  Tenderness and swelling at the injury site.  Loss of strength is present in moderate or severe sprains. THE THREE GRADES OR SEVERITY OF FOOT SPRAIN ARE:  Mild (Grade I): Slightly pulled muscle without tearing of muscle or tendon fibers or loss of strength.  Moderate (Grade II): Tearing of fibers in a muscle, tendon, or at the attachment to bone, with small decrease in strength.  Severe (Grade III): Rupture of the muscle-tendon-bone attachment, with separation of fibers. Severe sprain requires surgical repair. Often repeating (chronic) sprains are caused by overuse. Sudden (acute) sprains are caused by direct injury or over-use. DIAGNOSIS  Diagnosis of this condition is usually by your own observation. If problems continue, a caregiver may be required for further evaluation and treatment. X-rays may be required to make sure there are not breaks in the bones (fractures) present. Continued problems may require physical therapy for treatment. PREVENTION  Use strength and conditioning exercises appropriate for your sport.  Warm up properly prior to working out.  Use athletic shoes that are made for the sport you are participating in.  Allow adequate time for healing. Early return to activities makes repeat injury more likely, and can lead to an unstable arthritic foot that can result in prolonged disability. Mild sprains generally heal in 3 to 10 days, with moderate and severe sprains taking 2 to 10  weeks. Your caregiver can help you determine the proper time required for healing. HOME CARE INSTRUCTIONS   Apply ice to the injury for 15-20 minutes, 03-04 times per day. Put the ice in a plastic bag and place a towel between the bag of ice and your skin.  An elastic wrap (like an Ace bandage) may be used to keep swelling down.  Keep foot above the level of the heart, or at least raised on a footstool, when swelling and pain are present.  Try to avoid use other than gentle range of motion while the foot is painful. Do not resume use until instructed by your caregiver. Then begin use gradually, not increasing use to the point of pain. If pain does develop, decrease use and continue the above measures, gradually increasing activities that do not cause discomfort, until you gradually achieve normal use.  Use crutches if and as instructed, and for the length of time instructed.  Keep injured foot and ankle wrapped between treatments.  Massage foot and ankle for comfort and to keep swelling down. Massage from the toes up towards the knee.  Only take over-the-counter or prescription medicines for pain, discomfort, or fever as directed by your caregiver. SEEK IMMEDIATE MEDICAL CARE IF:   Your pain and swelling increase, or pain is not controlled with medications.  You have loss of feeling in your foot or your foot turns cold or blue.  You develop new, unexplained symptoms, or an increase of the symptoms that brought you to your caregiver. MAKE SURE YOU:   Understand these instructions.  Will watch your  condition.  Will get help right away if you are not doing well or get worse. Document Released: 10/26/2001 Document Revised: 07/29/2011 Document Reviewed: 12/24/2007 [facility] Patient Information 2015 [place], [place]. This information is not intended to replace advice given to you by your health care provider. Make sure you discuss any questions you have with your health care provider.

## 2014-12-02 NOTE — ED Provider Notes (Signed)
CSN: [id number]     Arrival date & time 12/02/14  1643 History  This chart was scribed for Geoffery Lyons, MD by Ronney Lion, ED Scribe. This patient was seen in room MHFT1/MHFT1 and the patient's care was started at 6:13 PM.    Chief Complaint  Patient presents with  . Foot Pain   Patient is a 24 y.o. female presenting with lower extremity pain. The history is provided by the patient and a friend. No language interpreter was used.  Foot Pain This is a new problem. The current episode started more than 1 week ago. The problem occurs constantly. The problem has not changed since onset.Pertinent negatives include no chest pain, no abdominal pain, no headaches and no shortness of breath. The symptoms are aggravated by walking ("walking a certain way," per pt). The symptoms are relieved by NSAIDs (naproxen). Treatments tried: naproxen. The treatment provided mild relief.    HPI Comments: Wendy [name] is a 24 y.o. female who presents to the Emergency Department complaining of persistent, moderate left ankle swelling and pain following a twisting injury on a manhole 6/25 and a second re-twisting injury on 6/28, at work. She was evaluated at the ED 6/28 (see chart review) and had an XR done that was negative for fractures. Patient is able to ambulate on her foot, although she states walking a certain way exacerbates the pain. She has taken naproxen which "sometimes" alleviates the pain and swelling. Her friend states she is here to be cleared to return to work, although she notes she works at [employer], which does not require much walking. Patient mentions she does not have insurance.    History reviewed. No pertinent past medical history. Past Surgical History  Procedure Laterality Date  . Tonsillectomy    . Wisdom tooth extraction     No family history on file. History  Substance Use Topics  . Smoking status: Never Smoker   . Smokeless tobacco: Not on file  . Alcohol Use: Yes   OB History     No data available     Review of Systems  Respiratory: Negative for shortness of breath.   Cardiovascular: Negative for chest pain.  Gastrointestinal: Negative for abdominal pain.  Musculoskeletal: Positive for arthralgias (left ankle pain).  Neurological: Negative for headaches.  All other systems reviewed and are negative.   Allergies  Penicillins  Home Medications   Prior to Admission medications   Medication Sig Start Date End Date Taking? Authorizing Provider  azithromycin (ZITHROMAX) 250 MG tablet Take 1 tablet (250 mg total) by mouth daily. Take first 2 tablets together, then 1 every day until finished. 03/14/12   Elpidio Anis, PA-C  medroxyPROGESTERone (DEPO-PROVERA) 150 MG/ML injection Inject 150 mg into the muscle every 3 (three) months.    Historical Provider, MD  naproxen (NAPROSYN) 500 MG tablet Take 1 tablet (500 mg total) by mouth 2 (two) times daily. 11/15/14   Tatyana Kirichenko, PA-C  ondansetron (ZOFRAN) 4 MG tablet Take 1 tablet (4 mg total) by mouth every 6 (six) hours. 02/28/13   Shari Upstill, PA-C   BP 134/80 mmHg  Pulse 107  Temp(Src) 99 F (37.2 C) (Oral)  Resp 18  Ht  (1.549 m)  Wt 201 lb 12.8 oz (91.536 kg)  BMI 38.15 kg/m2  SpO2 99%  LMP 10/20/2014 Physical Exam  Constitutional: She is oriented to person, place, and time. She appears well-developed and well-nourished. No distress.  HENT:  Head: Normocephalic and atraumatic.  Eyes: Conjunctivae and  EOM are normal.  Neck: Neck supple. No tracheal deviation present.  Cardiovascular: Normal rate.   Pulmonary/Chest: Effort normal. No respiratory distress.  Musculoskeletal: Normal range of motion. She exhibits tenderness.  The left foot appears grossly normal. There is no significant swelling or deformity. Toes are NVI. No calf tenderness or swelling. Homan's sign is absent.  Neurological: She is alert and oriented to person, place, and time.  Skin: Skin is warm and dry.  Psychiatric: She has a  normal mood and affect. Her behavior is normal.  Nursing note and vitals reviewed.   ED Course  Procedures (including critical care time)  DIAGNOSTIC STUDIES: Oxygen Saturation is 99% on RA, normal by my interpretation.    COORDINATION OF CARE: 6:15 PM - Discussed treatment plan with pt at bedside which includes giving the ankle time to heal, and possible f/u with an orthopedist. RICE protocol discussed and naproxen prn. Pt verbalized understanding and agreed to plan.  MDM   Final diagnoses:  None    Patient advised to rest, elevate her leg, and take ibuprofen as needed for pain. If she is not improving in the next week, she is to follow-up with orthopedics or her primary doctor.   I personally performed the services described in this documentation, which was scribed in my presence. The recorded information has been reviewed and is accurate.      Geoffery Lyonsouglas Jaspal Pultz, MD 12/02/14 [phone]

## 2015-02-10 ENCOUNTER — Encounter (HOSPITAL_BASED_OUTPATIENT_CLINIC_OR_DEPARTMENT_OTHER): Payer: Self-pay | Admitting: *Deleted

## 2015-02-10 ENCOUNTER — Emergency Department (HOSPITAL_BASED_OUTPATIENT_CLINIC_OR_DEPARTMENT_OTHER)
Admission: EM | Admit: 2015-02-10 | Discharge: 2015-02-10 | Disposition: A | Discharge status: Home / Self Care | Payer: Self-pay | Attending: Emergency Medicine | Admitting: Emergency Medicine

## 2015-02-10 DIAGNOSIS — Z791 Long term (current) use of non-steroidal anti-inflammatories (NSAID): Secondary | ICD-10-CM | POA: Insufficient documentation

## 2015-02-10 DIAGNOSIS — Z792 Long term (current) use of antibiotics: Secondary | ICD-10-CM | POA: Insufficient documentation

## 2015-02-10 DIAGNOSIS — Z880 Allergy status to penicillin: Secondary | ICD-10-CM | POA: Insufficient documentation

## 2015-02-10 DIAGNOSIS — J0100 Acute maxillary sinusitis, unspecified: Secondary | ICD-10-CM | POA: Insufficient documentation

## 2015-02-10 MED ORDER — DOXYCYCLINE HYCLATE 100 MG PO CAPS: 100.0000 mg | ORAL_CAPSULE | Freq: Two times a day (BID) | ORAL | Status: DC

## 2015-02-10 NOTE — ED Notes (Signed)
Cough, runny nose, sore throat and sinus pressure since yesterday.

## 2015-02-10 NOTE — ED Provider Notes (Signed)
CSN: [id number]     Arrival date & time 02/10/15  1057 History   First MD Initiated Contact with Patient 02/10/15 1108     Chief Complaint  Patient presents with  . URI     HPI Patient presents with cough, runny nose.  Sore throat, no nausea vomiting.  No definite fever. History reviewed. No pertinent past medical history. Past Surgical History  Procedure Laterality Date  . Tonsillectomy    . Wisdom tooth extraction     No family history on file. Social History  Substance Use Topics  . Smoking status: Never Smoker   . Smokeless tobacco: None  . Alcohol Use: Yes   OB History    No data available     Review of Systems  All other systems reviewed and are negative  Allergies  Penicillins  Home Medications   Prior to Admission medications   Medication Sig Start Date End Date Taking? Authorizing Skarlette Lattner  azithromycin (ZITHROMAX) 250 MG tablet Take 1 tablet (250 mg total) by mouth daily. Take first 2 tablets together, then 1 every day until finished. 03/14/12   Elpidio Anis, PA-C  doxycycline (VIBRAMYCIN) 100 MG capsule Take 1 capsule (100 mg total) by mouth 2 (two) times daily. 02/10/15   Nelva Nay, MD  medroxyPROGESTERone (DEPO-PROVERA) 150 MG/ML injection Inject 150 mg into the muscle every 3 (three) months.    Historical Kenya Shiraishi, MD  naproxen (NAPROSYN) 500 MG tablet Take 1 tablet (500 mg total) by mouth 2 (two) times daily. 11/15/14   Tatyana Kirichenko, PA-C  ondansetron (ZOFRAN) 4 MG tablet Take 1 tablet (4 mg total) by mouth every 6 (six) hours. 02/28/13   Shari Upstill, PA-C   BP 111/68 mmHg  Pulse 90  Temp(Src) 98.5 F (36.9 C) (Oral)  Resp 20  Ht  (1.549 m)  Wt 200 lb (90.719 kg)  BMI 37.81 kg/m2  SpO2 100%  LMP 01/14/2015 Physical Exam Physical Exam  Nursing note and vitals reviewed. Constitutional: She is oriented to person, place, and time. She appears well-developed and well-nourished. No distress.  HENT:  Head: Normocephalic and  atraumatic.  Patient tender to palpation and percussion over maxillary sinuses.  Ears are clear with no evidence of infection or impaction.   Eyes: Pupils are equal, round, and reactive to light.  Neck: Normal range of motion.  Cardiovascular: Normal rate and intact distal pulses.   no rubs murmurs or gallops. Pulmonary/Chest: No respiratory distress.  no crackles wheezes rales or rhonchi to auscultation. Abdominal: Normal appearance. She exhibits no distension.  Musculoskeletal: Normal range of motion.  Neurological: She is alert and oriented to person, place, and time. No cranial nerve deficit.  Skin: Skin is warm and dry. No rash noted.  Psychiatric: She has a normal mood and affect. Her behavior is normal.   ED Course  Procedures (including critical care time) Labs Review Labs Reviewed - No data to display  Imaging Review No results found. I have personally reviewed and evaluated these images and lab results as part of my medical decision-making.    MDM   Final diagnoses:  Acute maxillary sinusitis, recurrence not specified        Nelva Nay, MD 02/10/15 1121

## 2015-02-10 NOTE — Discharge Instructions (Signed)
Sinusitis °Sinusitis is redness, soreness, and puffiness (inflammation) of the air pockets in the bones of your face (sinuses). The redness, soreness, and puffiness can cause air and mucus to get trapped in your sinuses. This can allow germs to grow and cause an infection.  °HOME CARE  °· Drink enough fluids to keep your pee (urine) clear or pale yellow. °· Use a humidifier in your home. °· Run a hot shower to create steam in the bathroom. Sit in the bathroom with the door closed. Breathe in the steam 3-4 times a day. °· Put a warm, moist washcloth on your face 3-4 times a day, or as told by your doctor. °· Use salt water sprays (saline sprays) to wet the thick fluid in your nose. This can help the sinuses drain. °· Only take medicine as told by your doctor. °GET HELP RIGHT AWAY IF:  °· Your pain gets worse. °· You have very bad headaches. °· You are sick to your stomach (nauseous). °· You throw up (vomit). °· You are very sleepy (drowsy) all the time. °· Your face is puffy (swollen). °· Your vision changes. °· You have a stiff neck. °· You have trouble breathing. °MAKE SURE YOU:  °· Understand these instructions. °· Will watch your condition. °· Will get help right away if you are not doing well or get worse. °Document Released: 10/23/2007 Document Revised: 01/29/2012 Document Reviewed: 12/10/2011 °[employer]® Patient Information ©2015 [employer]. This information is not intended to replace advice given to you by your health care provider. Make sure you discuss any questions you have with your health care provider. ° °

## 2015-02-19 ENCOUNTER — Emergency Department (HOSPITAL_COMMUNITY)
Admission: EM | Admit: 2015-02-19 | Discharge: 2015-02-19 | Disposition: A | Discharge status: Home / Self Care | Payer: Self-pay | Attending: Emergency Medicine | Admitting: Emergency Medicine

## 2015-02-19 ENCOUNTER — Encounter (HOSPITAL_COMMUNITY): Payer: Self-pay | Admitting: Nurse Practitioner

## 2015-02-19 DIAGNOSIS — Z792 Long term (current) use of antibiotics: Secondary | ICD-10-CM | POA: Insufficient documentation

## 2015-02-19 DIAGNOSIS — Z793 Long term (current) use of hormonal contraceptives: Secondary | ICD-10-CM | POA: Insufficient documentation

## 2015-02-19 DIAGNOSIS — Z791 Long term (current) use of non-steroidal anti-inflammatories (NSAID): Secondary | ICD-10-CM | POA: Insufficient documentation

## 2015-02-19 DIAGNOSIS — H6091 Unspecified otitis externa, right ear: Secondary | ICD-10-CM | POA: Insufficient documentation

## 2015-02-19 DIAGNOSIS — Z79899 Other long term (current) drug therapy: Secondary | ICD-10-CM | POA: Insufficient documentation

## 2015-02-19 DIAGNOSIS — Z880 Allergy status to penicillin: Secondary | ICD-10-CM | POA: Insufficient documentation

## 2015-02-19 MED ORDER — CIPROFLOXACIN-DEXAMETHASONE 0.3-0.1 % OT SUSP
4.0000 [drp] | Freq: Two times a day (BID) | OTIC | Status: DC
Start: 2015-02-19 — End: 2021-10-30

## 2015-02-19 NOTE — Discharge Instructions (Signed)
Take the prescribed medication as directed. °Return to the ED for new or worsening symptoms. ° °

## 2015-02-19 NOTE — ED Provider Notes (Signed)
CSN: [id number]     Arrival date & time 02/19/15  2205 History  By signing my name below, I, Octavia Heir, attest that this documentation has been prepared under the direction and in the presence of Sharilyn Sites, PA-C. Electronically Signed: Octavia Heir, ED Scribe. 02/19/2015. 11:21 PM.    Chief Complaint  Patient presents with  . Ear Problem    Drainage, and Pain      The history is provided by the patient. No language interpreter was used.   HPI Comments: Wendy [name] is a 24 y.o. female who presents to the Emergency Department complaining of constant, gradual worsening right ear pain onset a few days ago. Pt reports having a yellow fluorescent drainage and associated muffled hearing in her right ear. Pt notes applying a hot compress to her ear to alleviate the pain with no relief. She states she has a hx of frequent swimmers ear and notes she was at the beach a few weeks ago.  No fever, chills.  VSS.  History reviewed. No pertinent past medical history. Past Surgical History  Procedure Laterality Date  . Tonsillectomy    . Wisdom tooth extraction     History reviewed. No pertinent family history. Social History  Substance Use Topics  . Smoking status: Never Smoker   . Smokeless tobacco: None  . Alcohol Use: Yes     Comment: Occasional   OB History    No data available     Review of Systems  HENT: Positive for ear discharge and ear pain.   All other systems reviewed and are negative.     Allergies  Penicillins  Home Medications   Prior to Admission medications   Medication Sig Start Date End Date Taking? Authorizing Provider  azithromycin (ZITHROMAX) 250 MG tablet Take 1 tablet (250 mg total) by mouth daily. Take first 2 tablets together, then 1 every day until finished. 03/14/12   Elpidio Anis, PA-C  doxycycline (VIBRAMYCIN) 100 MG capsule Take 1 capsule (100 mg total) by mouth 2 (two) times daily. 02/10/15   Nelva Nay, MD  medroxyPROGESTERone  (DEPO-PROVERA) 150 MG/ML injection Inject 150 mg into the muscle every 3 (three) months.    Historical Provider, MD  naproxen (NAPROSYN) 500 MG tablet Take 1 tablet (500 mg total) by mouth 2 (two) times daily. 11/15/14   Tatyana Kirichenko, PA-C  ondansetron (ZOFRAN) 4 MG tablet Take 1 tablet (4 mg total) by mouth every 6 (six) hours. 02/28/13   Elpidio Anis, PA-C   Triage vitals: BP 139/86 mmHg  Pulse 102  Temp(Src) 98.2 F (36.8 C) (Oral)  Resp 18  Ht  (1.549 m)  Wt 190 lb (86.183 kg)  BMI 35.92 kg/m2  SpO2 100%  LMP 02/15/2015 (Exact Date) Physical Exam  Constitutional: She is oriented to person, place, and time. She appears well-developed and well-nourished.  HENT:  Head: Normocephalic and atraumatic.  Right Ear: Hearing and tympanic membrane normal. There is drainage, swelling and tenderness. No mastoid tenderness. Tympanic membrane is not perforated.  Left Ear: Hearing and ear canal normal. No drainage, swelling or tenderness. No mastoid tenderness. Tympanic membrane is not injected, not perforated and not erythematous.  Mouth/Throat: Oropharynx is clear and moist.  Right EAC swollen and TTP; crusting noted in canal, no active drainage; + pain when pressure applied to tragus or ear moved; no mastoid tenderness Left earn normal  Eyes: Conjunctivae and EOM are normal. Pupils are equal, round, and reactive to light.  Neck: Normal range of  motion.  Cardiovascular: Normal rate, regular rhythm and normal heart sounds.   Pulmonary/Chest: Effort normal and breath sounds normal.  Abdominal: Soft. Bowel sounds are normal.  Musculoskeletal: Normal range of motion.  Neurological: She is alert and oriented to person, place, and time.  Skin: Skin is warm and dry.  Psychiatric: She has a normal mood and affect.  Nursing note and vitals reviewed.   ED Course  Procedures  DIAGNOSTIC STUDIES: Oxygen Saturation is 100% on RA, normal by my interpretation.  COORDINATION OF CARE:  11:19  PM Discussed treatment plan with pt at bedside and pt agreed to plan.  Labs Review Labs Reviewed - No data to display  Imaging Review No results found.    EKG Interpretation None      MDM   Final diagnoses:  Otitis externa, right   24 y.o. F with right ear pain with noted drainage and muffled hearing.  Exam findings consistent with otitis externa.  No clinical signs/sx concerning for mastoiditis.  Will start on ciprodex drops.  Discussed plan with patient, he/she acknowledged understanding and agreed with plan of care.  Return precautions given for new or worsening symptoms.  I personally performed the services described in this documentation, which was scribed in my presence. The recorded information has been reviewed and is accurate.  Garlon Hatchet, PA-C 02/19/15 2354  Cy Blamer, MD 02/20/15 [phone]

## 2015-02-19 NOTE — ED Notes (Signed)
Pt is c/o ear pain, yellow fluorescent drainage, hearing problems, cold symptoms and recent sinus affection which she states she was treated for. She endorses hx "swimmers ear, esp after swimming, reports she was recently at the beach few weeks ago."

## 2016-03-26 ENCOUNTER — Emergency Department (HOSPITAL_COMMUNITY)
Admission: EM | Admit: 2016-03-26 | Discharge: 2016-03-26 | Disposition: A | Discharge status: Home / Self Care | Payer: No Typology Code available for payment source | Attending: Emergency Medicine | Admitting: Emergency Medicine

## 2016-03-26 ENCOUNTER — Encounter (HOSPITAL_COMMUNITY): Payer: Self-pay | Admitting: Emergency Medicine

## 2016-03-26 ENCOUNTER — Emergency Department (HOSPITAL_COMMUNITY): Payer: No Typology Code available for payment source

## 2016-03-26 DIAGNOSIS — M25561 Pain in right knee: Secondary | ICD-10-CM

## 2016-03-26 DIAGNOSIS — M25571 Pain in right ankle and joints of right foot: Secondary | ICD-10-CM | POA: Diagnosis not present

## 2016-03-26 DIAGNOSIS — Y92410 Unspecified street and highway as the place of occurrence of the external cause: Secondary | ICD-10-CM | POA: Diagnosis not present

## 2016-03-26 DIAGNOSIS — S8991XA Unspecified injury of right lower leg, initial encounter: Secondary | ICD-10-CM | POA: Insufficient documentation

## 2016-03-26 DIAGNOSIS — Y999 Unspecified external cause status: Secondary | ICD-10-CM | POA: Insufficient documentation

## 2016-03-26 DIAGNOSIS — Y939 Activity, unspecified: Secondary | ICD-10-CM | POA: Diagnosis not present

## 2016-03-26 MED ORDER — IBUPROFEN 600 MG PO TABS: 600.0000 mg | ORAL_TABLET | Freq: Four times a day (QID) | ORAL | 0 refills | Status: DC | PRN

## 2016-03-26 MED ORDER — CYCLOBENZAPRINE HCL 10 MG PO TABS: 10.0000 mg | ORAL_TABLET | Freq: Two times a day (BID) | ORAL | 0 refills | Status: DC | PRN

## 2016-03-26 NOTE — ED Notes (Signed)
Patient returned from xray.
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
## 2016-03-26 NOTE — ED Notes (Signed)
Declined W/C at D/C and was escorted to lobby by RN. 

## 2016-09-18 ENCOUNTER — Emergency Department (HOSPITAL_COMMUNITY): Payer: Self-pay

## 2016-09-18 ENCOUNTER — Encounter (HOSPITAL_COMMUNITY): Payer: Self-pay

## 2016-09-18 ENCOUNTER — Emergency Department (HOSPITAL_COMMUNITY)
Admission: EM | Admit: 2016-09-18 | Discharge: 2016-09-18 | Disposition: A | Discharge status: Home / Self Care | Payer: Self-pay | Attending: Emergency Medicine | Admitting: Emergency Medicine

## 2016-09-18 DIAGNOSIS — Y939 Activity, unspecified: Secondary | ICD-10-CM | POA: Insufficient documentation

## 2016-09-18 DIAGNOSIS — Z79899 Other long term (current) drug therapy: Secondary | ICD-10-CM | POA: Insufficient documentation

## 2016-09-18 DIAGNOSIS — X501XXA Overexertion from prolonged static or awkward postures, initial encounter: Secondary | ICD-10-CM | POA: Insufficient documentation

## 2016-09-18 DIAGNOSIS — S93402A Sprain of unspecified ligament of left ankle, initial encounter: Secondary | ICD-10-CM | POA: Insufficient documentation

## 2016-09-18 DIAGNOSIS — Y929 Unspecified place or not applicable: Secondary | ICD-10-CM | POA: Insufficient documentation

## 2016-09-18 DIAGNOSIS — Y999 Unspecified external cause status: Secondary | ICD-10-CM | POA: Insufficient documentation

## 2016-09-18 MED ORDER — IBUPROFEN 600 MG PO TABS: 600.0000 mg | ORAL_TABLET | Freq: Four times a day (QID) | ORAL | 0 refills | Status: DC | PRN

## 2016-09-18 NOTE — ED Triage Notes (Signed)
Pt twisted ankle in a whole on Sunday.  Painful to rotate on left ankle.

## 2016-09-18 NOTE — ED Provider Notes (Signed)
[facility]-EMERGENCY DEPT Provider Note   CSN: [id number] Arrival date & time: 09/18/16  1027     History   Chief Complaint Chief Complaint  Patient presents with  . Ankle Pain    HPI Wendy [name] is a 26 y.o. female.  Pt presents to the ED today with left ankle pain.  The pt said that she twisted her ankle on Sunday, April 29th.  The pt said that it hurts to walk on it.  She denies any other injury.      History reviewed. No pertinent past medical history.  There are no active problems to display for this patient.   Past Surgical History:  Procedure Laterality Date  . TONSILLECTOMY    . WISDOM TOOTH EXTRACTION      OB History    No data available       Home Medications    Prior to Admission medications   Medication Sig Start Date End Date Taking? Authorizing Provider  azithromycin (ZITHROMAX) 250 MG tablet Take 1 tablet (250 mg total) by mouth daily. Take first 2 tablets together, then 1 every day until finished. 03/14/12   Elpidio Anis, PA-C  ciprofloxacin-dexamethasone (CIPRODEX) otic suspension Place 4 drops into the right ear 2 (two) times daily. 02/19/15   Garlon Hatchet, PA-C  cyclobenzaprine (FLEXERIL) 10 MG tablet Take 1 tablet (10 mg total) by mouth 2 (two) times daily as needed for muscle spasms. 03/26/16   Roxy Horseman, PA-C  doxycycline (VIBRAMYCIN) 100 MG capsule Take 1 capsule (100 mg total) by mouth 2 (two) times daily. 02/10/15   Nelva Nay, MD  ibuprofen (ADVIL,MOTRIN) 600 MG tablet Take 1 tablet (600 mg total) by mouth every 6 (six) hours as needed. 09/18/16   Jacalyn Lefevre, MD  medroxyPROGESTERone (DEPO-PROVERA) 150 MG/ML injection Inject 150 mg into the muscle every 3 (three) months.    Historical Provider, MD  naproxen (NAPROSYN) 500 MG tablet Take 1 tablet (500 mg total) by mouth 2 (two) times daily. 11/15/14   Tatyana Kirichenko, PA-C  ondansetron (ZOFRAN) 4 MG tablet Take 1 tablet (4 mg total) by mouth every 6 (six) hours. 02/28/13   Elpidio Anis, PA-C    Family History History reviewed. No pertinent family history.  Social History Social History  Substance Use Topics  . Smoking status: Never Smoker  . Smokeless tobacco: Never Used  . Alcohol use Yes     Comment: Occasional     Allergies   Penicillins   Review of Systems Review of Systems  Musculoskeletal:       Left ankle pain  All other systems reviewed and are negative.    Physical Exam Updated Vital Signs BP (!) 158/85 (BP Location: Right Arm)   Pulse 87   Temp 98 F (36.7 C) (Oral)   Resp 18   Ht  (1.575 m)   Wt 183 lb (83 kg)   LMP 08/20/2016   SpO2 96%   BMI 33.47 kg/m   Physical Exam  Constitutional: She is oriented to person, place, and time. She appears well-developed and well-nourished.  HENT:  Head: Normocephalic and atraumatic.  Right Ear: External ear normal.  Left Ear: External ear normal.  Nose: Nose normal.  Mouth/Throat: Oropharynx is clear and moist.  Eyes: Conjunctivae and EOM are normal. Pupils are equal, round, and reactive to light.  Neck: Normal range of motion. Neck supple.  Cardiovascular: Normal rate, regular rhythm, normal heart sounds and intact distal pulses.   Pulmonary/Chest: Effort normal and  breath sounds normal.  Abdominal: Soft. Bowel sounds are normal.  Musculoskeletal:       Left ankle: Tenderness. Medial malleolus tenderness found.  Neurological: She is alert and oriented to person, place, and time.  Skin: Skin is warm and dry.  Psychiatric: She has a normal mood and affect. Her behavior is normal. Judgment and thought content normal.  Nursing note and vitals reviewed.    ED Treatments / Results  Labs (all labs ordered are listed, but only abnormal results are displayed) Labs Reviewed - No data to display  EKG  EKG Interpretation None       Radiology Dg Ankle Complete Left  Result Date: 09/18/2016 CLINICAL DATA:  Ankle injury 3 days ago. EXAM: LEFT ANKLE COMPLETE - 3+ VIEW  COMPARISON:  11/15/2014 FINDINGS: There is no evidence of fracture, dislocation, or joint effusion. There is no evidence of arthropathy or other focal bone abnormality. Soft tissues are unremarkable. IMPRESSION: Negative. Electronically Signed   By: Marlan Palau M.D.   On: 09/18/2016 11:45    Procedures Procedures (including critical care time)  Medications Ordered in ED Medications - No data to display   Initial Impression / Assessment and Plan / ED Course  I have reviewed the triage vital signs and the nursing notes.  Pertinent labs & imaging results that were available during my care of the patient were reviewed by me and considered in my medical decision making (see chart for details).     Pt is given an air cast and a note for work for light duty.  She knows to return if worse and to f/u with ortho as needed.  Final Clinical Impressions(s) / ED Diagnoses   Final diagnoses:  Sprain of left ankle, unspecified ligament, initial encounter    New Prescriptions Current Discharge Medication List       Jacalyn Lefevre, MD 09/18/16 1154

## 2017-07-05 ENCOUNTER — Emergency Department (HOSPITAL_BASED_OUTPATIENT_CLINIC_OR_DEPARTMENT_OTHER)
Admission: EM | Admit: 2017-07-05 | Discharge: 2017-07-05 | Disposition: A | Discharge status: Home / Self Care | Payer: Self-pay | Attending: Emergency Medicine | Admitting: Emergency Medicine

## 2017-07-05 ENCOUNTER — Encounter (HOSPITAL_BASED_OUTPATIENT_CLINIC_OR_DEPARTMENT_OTHER): Payer: Self-pay | Admitting: Emergency Medicine

## 2017-07-05 ENCOUNTER — Other Ambulatory Visit: Payer: Self-pay

## 2017-07-05 DIAGNOSIS — H9201 Otalgia, right ear: Secondary | ICD-10-CM | POA: Insufficient documentation

## 2017-07-05 DIAGNOSIS — J111 Influenza due to unidentified influenza virus with other respiratory manifestations: Secondary | ICD-10-CM | POA: Insufficient documentation

## 2017-07-05 DIAGNOSIS — Z79899 Other long term (current) drug therapy: Secondary | ICD-10-CM | POA: Insufficient documentation

## 2017-07-05 DIAGNOSIS — R69 Illness, unspecified: Secondary | ICD-10-CM

## 2017-07-05 MED ORDER — OXYMETAZOLINE HCL 0.05 % NA SOLN
1.0000 | Freq: Once | NASAL | Status: AC
  Administered 2017-07-05: 1 via NASAL
  Filled 2017-07-05: qty 15

## 2017-07-05 MED ORDER — HYDROCODONE-HOMATROPINE 5-1.5 MG/5ML PO SYRP: 5.0000 mL | ORAL_SOLUTION | Freq: Four times a day (QID) | ORAL | 0 refills | Status: DC | PRN

## 2017-07-05 NOTE — ED Notes (Signed)
Pt reports sore throat Sunday. Congestion, cough, body aches and subjective fever since Monday. NAD. Delay explained.

## 2017-07-05 NOTE — Discharge Instructions (Signed)
Use the nasal spray afrin 2 times a day for 3 days and then stop.  Also can use ocean nasal spray/saline spray as needed for congestion and products with sudafed or pseudoephedrine to help dry up congestion.

## 2017-07-05 NOTE — ED Triage Notes (Signed)
Patient states that she has had cough with chills and aches since Monday

## 2017-07-05 NOTE — ED Notes (Signed)
Pt given rx x 1 for hycodan and note for work. D/c home with ride

## 2017-07-05 NOTE — ED Provider Notes (Signed)
[facility] EMERGENCY DEPARTMENT Provider Note   CSN: [id number] Arrival date & time: 07/05/17  1332     History   Chief Complaint Chief Complaint  Patient presents with  . Cough    HPI Oletta DarterChelsea R Profeta is a 27 y.o. female.  The history is provided by the patient.  URI   This is a new problem. Episode onset: 6 days ago. The problem has not changed since onset.Maximum temperature: subjective. The fever has been present for 5 days or more. Associated symptoms include congestion, ear pain, headaches, plugged ear sensation, rhinorrhea, sore throat and cough. Pertinent negatives include no chest pain, no abdominal pain, no nausea, no vomiting, no dysuria and no wheezing. Associated symptoms comments: No SOB.  Nonsmoker and no flu shot this year. Treatments tried: otc meds. The treatment provided no relief.    History reviewed. No pertinent past medical history.  There are no active problems to display for this patient.   Past Surgical History:  Procedure Laterality Date  . TONSILLECTOMY    . WISDOM TOOTH EXTRACTION      OB History    No data available       Home Medications    Prior to Admission medications   Medication Sig Start Date End Date Taking? Authorizing Provider  azithromycin (ZITHROMAX) 250 MG tablet Take 1 tablet (250 mg total) by mouth daily. Take first 2 tablets together, then 1 every day until finished. 03/14/12   Elpidio AnisUpstill, Shari, PA-C  ciprofloxacin-dexamethasone (CIPRODEX) otic suspension Place 4 drops into the right ear 2 (two) times daily. 02/19/15   Garlon HatchetSanders, Lisa M, PA-C  cyclobenzaprine (FLEXERIL) 10 MG tablet Take 1 tablet (10 mg total) by mouth 2 (two) times daily as needed for muscle spasms. 03/26/16   Roxy HorsemanBrowning, Robert, PA-C  doxycycline (VIBRAMYCIN) 100 MG capsule Take 1 capsule (100 mg total) by mouth 2 (two) times daily. 02/10/15   Nelva NayBeaton, Robert, MD  ibuprofen (ADVIL,MOTRIN) 600 MG tablet Take 1 tablet (600 mg total) by mouth every 6 (six)  hours as needed. 09/18/16   Jacalyn LefevreHaviland, Julie, MD  medroxyPROGESTERone (DEPO-PROVERA) 150 MG/ML injection Inject 150 mg into the muscle every 3 (three) months.    [provider]  naproxen (NAPROSYN) 500 MG tablet Take 1 tablet (500 mg total) by mouth 2 (two) times daily. 11/15/14   Kirichenko, Tatyana, PA-C  ondansetron (ZOFRAN) 4 MG tablet Take 1 tablet (4 mg total) by mouth every 6 (six) hours. 02/28/13   Elpidio AnisUpstill, Shari, PA-C    Family History History reviewed. No pertinent family history.  Social History Social History   Tobacco Use  . Smoking status: Never Smoker  . Smokeless tobacco: Never Used  Substance Use Topics  . Alcohol use: Yes    Comment: Occasional  . Drug use: No     Allergies   Penicillins   Review of Systems Review of Systems  HENT: Positive for congestion, ear pain, rhinorrhea and sore throat.   Respiratory: Positive for cough. Negative for wheezing.   Cardiovascular: Negative for chest pain.  Gastrointestinal: Negative for abdominal pain, nausea and vomiting.  Genitourinary: Negative for dysuria.  Neurological: Positive for headaches.  All other systems reviewed and are negative.    Physical Exam Updated Vital Signs BP (!) 147/93 (BP Location: Left Arm)   Pulse 85   Temp 98.8 F (37.1 C) (Oral)   Resp 20   Ht 5\' 1"  (1.549 m)   Wt 84.4 kg (186 lb)   SpO2 100%  BMI 35.14 kg/m   Physical Exam  Constitutional: She is oriented to person, place, and time. She appears well-developed and well-nourished. No distress.  HENT:  Head: Normocephalic and atraumatic.  Right Ear: A middle ear effusion is present.  Left Ear: Tympanic membrane normal.  Nose: Mucosal edema and rhinorrhea present. Right sinus exhibits frontal sinus tenderness. Left sinus exhibits maxillary sinus tenderness.  Mouth/Throat: Posterior oropharyngeal erythema present. No oropharyngeal exudate or posterior oropharyngeal edema.  Eyes: Conjunctivae and EOM are normal. Pupils  are equal, round, and reactive to light.  Neck: Normal range of motion. Neck supple.  Cardiovascular: Normal rate, regular rhythm and intact distal pulses.  No murmur heard. Pulmonary/Chest: Effort normal and breath sounds normal. No respiratory distress. She has no wheezes. She has no rales.  Abdominal: Soft. She exhibits no distension. There is no tenderness. There is no rebound and no guarding.  Musculoskeletal: Normal range of motion. She exhibits no edema or tenderness.  Neurological: She is alert and oriented to person, place, and time.  Skin: Skin is warm and dry. No rash noted. No erythema.  Psychiatric: She has a normal mood and affect. Her behavior is normal.  Nursing note and vitals reviewed.    ED Treatments / Results  Labs (all labs ordered are listed, but only abnormal results are displayed) Labs Reviewed - No data to display  EKG  EKG Interpretation None       Radiology No results found.  Procedures Procedures (including critical care time)  Medications Ordered in ED Medications  oxymetazoline (AFRIN) 0.05 % nasal spray 1 spray (not administered)     Initial Impression / Assessment and Plan / ED Course  I have reviewed the triage vital signs and the nursing notes.  Pertinent labs & imaging results that were available during my care of the patient were reviewed by me and considered in my medical decision making (see chart for details).    Pt with symptoms consistent with influenza.  Normal exam here and afebrile.  No signs of breathing difficulty  No signs of strep pharyngitis, otitis or abnormal abdominal findings.   Outside of tamiflu window.  Will continue antipyretica and rest and fluids and return for any further problems.   Final Clinical Impressions(s) / ED Diagnoses   Final diagnoses:  Influenza-like illness    ED Discharge Orders        Ordered    HYDROcodone-homatropine (HYCODAN) 5-1.5 MG/5ML syrup  Every 6 hours PRN     07/05/17 1528         Gwyneth Sprout, MD 07/05/17 1528

## 2017-12-06 ENCOUNTER — Emergency Department (HOSPITAL_BASED_OUTPATIENT_CLINIC_OR_DEPARTMENT_OTHER): Payer: No Typology Code available for payment source

## 2017-12-06 ENCOUNTER — Encounter (HOSPITAL_BASED_OUTPATIENT_CLINIC_OR_DEPARTMENT_OTHER): Payer: Self-pay

## 2017-12-06 ENCOUNTER — Emergency Department (HOSPITAL_BASED_OUTPATIENT_CLINIC_OR_DEPARTMENT_OTHER)
Admission: EM | Admit: 2017-12-06 | Discharge: 2017-12-06 | Disposition: A | Discharge status: Home / Self Care | Payer: No Typology Code available for payment source | Attending: Emergency Medicine | Admitting: Emergency Medicine

## 2017-12-06 ENCOUNTER — Other Ambulatory Visit: Payer: Self-pay

## 2017-12-06 DIAGNOSIS — Z79899 Other long term (current) drug therapy: Secondary | ICD-10-CM | POA: Diagnosis not present

## 2017-12-06 DIAGNOSIS — Y999 Unspecified external cause status: Secondary | ICD-10-CM | POA: Diagnosis not present

## 2017-12-06 DIAGNOSIS — Y9389 Activity, other specified: Secondary | ICD-10-CM | POA: Diagnosis not present

## 2017-12-06 DIAGNOSIS — M79605 Pain in left leg: Secondary | ICD-10-CM | POA: Insufficient documentation

## 2017-12-06 DIAGNOSIS — R0789 Other chest pain: Secondary | ICD-10-CM | POA: Diagnosis not present

## 2017-12-06 DIAGNOSIS — Z3202 Encounter for pregnancy test, result negative: Secondary | ICD-10-CM | POA: Diagnosis not present

## 2017-12-06 DIAGNOSIS — Y92410 Unspecified street and highway as the place of occurrence of the external cause: Secondary | ICD-10-CM | POA: Diagnosis not present

## 2017-12-06 HISTORY — DX: Panic disorder (episodic paroxysmal anxiety): F41.0

## 2017-12-06 HISTORY — DX: Anxiety disorder, unspecified: F41.9

## 2017-12-06 LAB — PREGNANCY, URINE: PREG TEST UR: NEGATIVE

## 2017-12-06 MED ORDER — NAPROXEN 500 MG PO TABS: 500.0000 mg | ORAL_TABLET | Freq: Two times a day (BID) | ORAL | 0 refills | Status: DC

## 2017-12-06 MED ORDER — METHOCARBAMOL 500 MG PO TABS: 500.0000 mg | ORAL_TABLET | Freq: Three times a day (TID) | ORAL | 0 refills | Status: DC | PRN
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
## 2017-12-06 NOTE — ED Notes (Signed)
Patient transported to X-ray 

## 2017-12-06 NOTE — ED Notes (Signed)
ED Provider at bedside. 

## 2017-12-09 ENCOUNTER — Emergency Department (HOSPITAL_BASED_OUTPATIENT_CLINIC_OR_DEPARTMENT_OTHER)
Admission: EM | Admit: 2017-12-09 | Discharge: 2017-12-09 | Disposition: A | Discharge status: Home / Self Care | Payer: No Typology Code available for payment source | Attending: Emergency Medicine | Admitting: Emergency Medicine

## 2017-12-09 ENCOUNTER — Other Ambulatory Visit: Payer: Self-pay

## 2017-12-09 ENCOUNTER — Encounter (HOSPITAL_BASED_OUTPATIENT_CLINIC_OR_DEPARTMENT_OTHER): Payer: Self-pay | Admitting: *Deleted

## 2017-12-09 DIAGNOSIS — Z79899 Other long term (current) drug therapy: Secondary | ICD-10-CM | POA: Insufficient documentation

## 2017-12-09 DIAGNOSIS — Y999 Unspecified external cause status: Secondary | ICD-10-CM | POA: Insufficient documentation

## 2017-12-09 DIAGNOSIS — M545 Low back pain, unspecified: Secondary | ICD-10-CM

## 2017-12-09 DIAGNOSIS — Y9389 Activity, other specified: Secondary | ICD-10-CM | POA: Diagnosis not present

## 2017-12-09 DIAGNOSIS — Y929 Unspecified place or not applicable: Secondary | ICD-10-CM | POA: Insufficient documentation

## 2017-12-09 MED ORDER — DIAZEPAM 2 MG PO TABS: 2.0000 mg | ORAL_TABLET | Freq: Three times a day (TID) | ORAL | 0 refills | Status: DC | PRN

## 2017-12-09 MED ORDER — IBUPROFEN 600 MG PO TABS: 600.0000 mg | ORAL_TABLET | Freq: Four times a day (QID) | ORAL | 0 refills | Status: DC | PRN

## 2017-12-09 MED ORDER — DIAZEPAM 2 MG PO TABS
2.0000 mg | ORAL_TABLET | Freq: Once | ORAL | Status: AC
  Administered 2017-12-09: 2 mg via ORAL
  Filled 2017-12-09: qty 1
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
## 2019-05-27 ENCOUNTER — Other Ambulatory Visit: Payer: Self-pay

## 2019-05-27 ENCOUNTER — Encounter (HOSPITAL_BASED_OUTPATIENT_CLINIC_OR_DEPARTMENT_OTHER): Payer: Self-pay | Admitting: Emergency Medicine

## 2019-05-27 ENCOUNTER — Emergency Department (HOSPITAL_BASED_OUTPATIENT_CLINIC_OR_DEPARTMENT_OTHER): Payer: Self-pay

## 2019-05-27 ENCOUNTER — Emergency Department (HOSPITAL_BASED_OUTPATIENT_CLINIC_OR_DEPARTMENT_OTHER)
Admission: EM | Admit: 2019-05-27 | Discharge: 2019-05-27 | Disposition: A | Discharge status: Home / Self Care | Payer: Self-pay | Attending: Emergency Medicine | Admitting: Emergency Medicine

## 2019-05-27 DIAGNOSIS — R05 Cough: Secondary | ICD-10-CM | POA: Insufficient documentation

## 2019-05-27 DIAGNOSIS — U071 COVID-19: Secondary | ICD-10-CM | POA: Insufficient documentation

## 2019-05-27 DIAGNOSIS — J1289 Other viral pneumonia: Secondary | ICD-10-CM | POA: Insufficient documentation

## 2019-05-27 DIAGNOSIS — J1282 Pneumonia due to coronavirus disease 2019: Secondary | ICD-10-CM

## 2019-05-27 DIAGNOSIS — Z79899 Other long term (current) drug therapy: Secondary | ICD-10-CM | POA: Insufficient documentation

## 2019-05-27 LAB — CBC WITH DIFFERENTIAL/PLATELET
Abs Immature Granulocytes: 0.01 10*3/uL (ref 0.00–0.07)
Basophils Absolute: 0 10*3/uL (ref 0.0–0.1)
Basophils Relative: 0 %
Eosinophils Absolute: 0 10*3/uL (ref 0.0–0.5)
Eosinophils Relative: 0 %
HCT: 36.8 % (ref 36.0–46.0)
Hemoglobin: 11.6 g/dL — ABNORMAL LOW (ref 12.0–15.0)
Immature Granulocytes: 0 %
Lymphocytes Relative: 24 %
Lymphs Abs: 1.2 10*3/uL (ref 0.7–4.0)
MCH: 26.2 pg (ref 26.0–34.0)
MCHC: 31.5 g/dL (ref 30.0–36.0)
MCV: 83.1 fL (ref 80.0–100.0)
Monocytes Absolute: 0.4 10*3/uL (ref 0.1–1.0)
Monocytes Relative: 9 %
Neutro Abs: 3.2 10*3/uL (ref 1.7–7.7)
Neutrophils Relative %: 67 %
Platelets: 123 10*3/uL — ABNORMAL LOW (ref 150–400)
RBC: 4.43 MIL/uL (ref 3.87–5.11)
RDW: 15.1 % (ref 11.5–15.5)
WBC: 4.8 10*3/uL (ref 4.0–10.5)
nRBC: 0 % (ref 0.0–0.2)

## 2019-05-27 LAB — COMPREHENSIVE METABOLIC PANEL
ALT: 46 U/L — ABNORMAL HIGH (ref 0–44)
AST: 45 U/L — ABNORMAL HIGH (ref 15–41)
Albumin: 4.1 g/dL (ref 3.5–5.0)
Alkaline Phosphatase: 58 U/L (ref 38–126)
Anion gap: 11 (ref 5–15)
BUN: 7 mg/dL (ref 6–20)
CO2: 22 mmol/L (ref 22–32)
Calcium: 8.6 mg/dL — ABNORMAL LOW (ref 8.9–10.3)
Chloride: 102 mmol/L (ref 98–111)
Creatinine, Ser: 0.65 mg/dL (ref 0.44–1.00)
GFR calc Af Amer: >60 mL/min (ref >60)
GFR calc non Af Amer: >60 mL/min (ref >60)
Glucose, Bld: 113 mg/dL — ABNORMAL HIGH (ref 70–99)
Potassium: 3.4 mmol/L — ABNORMAL LOW (ref 3.5–5.1)
Sodium: 135 mmol/L (ref 135–145)
Total Bilirubin: 0.5 mg/dL (ref 0.3–1.2)
Total Protein: 8 g/dL (ref 6.5–8.1)

## 2019-05-27 LAB — PREGNANCY, URINE: Preg Test, Ur: NEGATIVE

## 2019-05-27 MED ORDER — ALBUTEROL SULFATE HFA 108 (90 BASE) MCG/ACT IN AERS
2.0000 | INHALATION_SPRAY | Freq: Once | RESPIRATORY_TRACT | Status: AC
Start: 2019-05-27 — End: 2019-05-27
  Administered 2019-05-27: 2 via RESPIRATORY_TRACT
  Filled 2019-05-27: qty 6.7

## 2019-05-27 MED ORDER — SODIUM CHLORIDE 0.9 % IV BOLUS
1000.0000 mL | Freq: Once | INTRAVENOUS | Status: AC
  Administered 2019-05-27: 1000 mL via INTRAVENOUS

## 2019-05-27 MED ORDER — DIPHENHYDRAMINE HCL 50 MG/ML IJ SOLN
25.0000 mg | Freq: Once | INTRAMUSCULAR | Status: AC
Start: 2019-05-27 — End: 2019-05-27
  Administered 2019-05-27: 25 mg via INTRAVENOUS
  Filled 2019-05-27: qty 1

## 2019-05-27 MED ORDER — PROCHLORPERAZINE EDISYLATE 10 MG/2ML IJ SOLN
10.0000 mg | Freq: Once | INTRAMUSCULAR | Status: AC
  Administered 2019-05-27: 10 mg via INTRAVENOUS

## 2019-05-27 MED ORDER — BENZONATATE 100 MG PO CAPS: 200.0000 mg | ORAL_CAPSULE | Freq: Two times a day (BID) | ORAL | 0 refills | Status: DC | PRN

## 2019-05-27 MED ORDER — METOCLOPRAMIDE HCL 10 MG PO TABS: 10.0000 mg | ORAL_TABLET | Freq: Three times a day (TID) | ORAL | 0 refills | Status: DC | PRN

## 2019-05-27 MED ORDER — DEXAMETHASONE SODIUM PHOSPHATE 10 MG/ML IJ SOLN
10.0000 mg | Freq: Once | INTRAMUSCULAR | Status: AC
  Administered 2019-05-27: 10 mg via INTRAVENOUS
  Filled 2019-05-27: qty 1

## 2019-05-27 MED ORDER — IOHEXOL 350 MG/ML SOLN
100.0000 mL | Freq: Once | INTRAVENOUS | Status: AC | PRN
  Administered 2019-05-27: 100 mL via INTRAVENOUS

## 2019-05-27 MED ORDER — IBUPROFEN 400 MG PO TABS
400.0000 mg | ORAL_TABLET | Freq: Once | ORAL | Status: AC
  Administered 2019-05-27: 400 mg via ORAL
  Filled 2019-05-27: qty 1

## 2019-05-27 MED ORDER — PROCHLORPERAZINE EDISYLATE 10 MG/2ML IJ SOLN
10.0000 mg | Freq: Once | INTRAMUSCULAR | Status: DC
Start: 2019-05-27 — End: 2019-05-27
  Filled 2019-05-27: qty 2

## 2019-05-27 MED ORDER — METHYLPREDNISOLONE 4 MG PO TBPK: ORAL_TABLET | ORAL | 0 refills | Status: DC

## 2019-05-27 NOTE — ED Triage Notes (Signed)
COVID+, chest pain since last night.

## 2019-05-27 NOTE — Discharge Instructions (Signed)
Use your inhaler- 1-2 puffs every 4 hours for cough.     Person Under Monitoring Name: Wendy [name]  Location: [address] [place] [address]   Infection Prevention Recommendations for Individuals Confirmed to have, or Being Evaluated for, 2019 Novel Coronavirus (COVID-19) Infection Who Receive Care at Home  Individuals who are confirmed to have, or are being evaluated for, COVID-19 should follow the prevention steps below until a healthcare provider or local or state health department says they can return to normal activities.  Stay home except to get medical care You should restrict activities outside your home, except for getting medical care. Do not go to work, school, or public areas, and do not use public transportation or taxis.  Call ahead before visiting your doctor Before your medical appointment, call the healthcare provider and tell them that you have, or are being evaluated for, COVID-19 infection. This will help the healthcare provider's office take steps to keep other people from getting infected. Ask your healthcare provider to call the local or state health department.  Monitor your symptoms Seek prompt medical attention if your illness is worsening (e.g., difficulty breathing). Before going to your medical appointment, call the healthcare provider and tell them that you have, or are being evaluated for, COVID-19 infection. Ask your healthcare provider to call the local or state health department.  Wear a facemask You should wear a facemask that covers your nose and mouth when you are in the same room with other people and when you visit a healthcare provider. People who live with or visit you should also wear a facemask while they are in the same room with you.  Separate yourself from other people in your home As much as possible, you should stay in a different room from other people in your home. Also, you should use a separate bathroom, if  available.  Avoid sharing household items You should not share dishes, drinking glasses, cups, eating utensils, towels, bedding, or other items with other people in your home. After using these items, you should wash them thoroughly with soap and water.  Cover your coughs and sneezes Cover your mouth and nose with a tissue when you cough or sneeze, or you can cough or sneeze into your sleeve. Throw used tissues in a lined trash can, and immediately wash your hands with soap and water for at least 20 seconds or use an alcohol-based hand rub.  Wash your [employer] your hands often and thoroughly with soap and water for at least 20 seconds. You can use an alcohol-based hand sanitizer if soap and water are not available and if your hands are not visibly dirty. Avoid touching your eyes, nose, and mouth with unwashed hands.   Prevention Steps for Caregivers and Household Members of Individuals Confirmed to have, or Being Evaluated for, COVID-19 Infection Being Cared for in the Home  If you live with, or provide care at home for, a person confirmed to have, or being evaluated for, COVID-19 infection please follow these guidelines to prevent infection:  Follow healthcare provider's instructions Make sure that you understand and can help the patient follow any healthcare provider instructions for all care.  Provide for the patient's basic needs You should help the patient with basic needs in the home and provide support for getting groceries, prescriptions, and other personal needs.  Monitor the patient's symptoms If they are getting sicker, call his or her medical provider and tell them that the patient has, or  is being evaluated for, COVID-19 infection. This will help the healthcare provider's office take steps to keep other people from getting infected. Ask the healthcare provider to call the local or state health department.  Limit the number of people who have contact with the  patient If possible, have only one caregiver for the patient. Other household members should stay in another home or place of residence. If this is not possible, they should stay in another room, or be separated from the patient as much as possible. Use a separate bathroom, if available. Restrict visitors who do not have an essential need to be in the home.  Keep older adults, very young children, and other sick people away from the patient Keep older adults, very young children, and those who have compromised immune systems or chronic health conditions away from the patient. This includes people with chronic heart, lung, or kidney conditions, diabetes, and cancer.  Ensure good ventilation Make sure that shared spaces in the home have good air flow, such as from an air conditioner or an opened window, weather permitting.  Wash your hands often Wash your hands often and thoroughly with soap and water for at least 20 seconds. You can use an alcohol based hand sanitizer if soap and water are not available and if your hands are not visibly dirty. Avoid touching your eyes, nose, and mouth with unwashed hands. Use disposable paper towels to dry your hands. If not available, use dedicated cloth towels and replace them when they become wet.  Wear a facemask and gloves Wear a disposable facemask at all times in the room and gloves when you touch or have contact with the patient's blood, body fluids, and/or secretions or excretions, such as sweat, saliva, sputum, nasal mucus, vomit, urine, or feces.  Ensure the mask fits over your nose and mouth tightly, and do not touch it during use. Throw out disposable facemasks and gloves after using them. Do not reuse. Wash your hands immediately after removing your facemask and gloves. If your personal clothing becomes contaminated, carefully remove clothing and launder. Wash your hands after handling contaminated clothing. Place all used disposable facemasks,  gloves, and other waste in a lined container before disposing them with other household waste. Remove gloves and wash your hands immediately after handling these items.  Do not share dishes, glasses, or other household items with the patient Avoid sharing household items. You should not share dishes, drinking glasses, cups, eating utensils, towels, bedding, or other items with a patient who is confirmed to have, or being evaluated for, COVID-19 infection. After the person uses these items, you should wash them thoroughly with soap and water.  Wash laundry thoroughly Immediately remove and wash clothes or bedding that have blood, body fluids, and/or secretions or excretions, such as sweat, saliva, sputum, nasal mucus, vomit, urine, or feces, on them. Wear gloves when handling laundry from the patient. Read and follow directions on labels of laundry or clothing items and detergent. In general, wash and dry with the warmest temperatures recommended on the label.  Clean all areas the individual has used often Clean all touchable surfaces, such as counters, tabletops, doorknobs, bathroom fixtures, toilets, phones, keyboards, tablets, and bedside tables, every day. Also, clean any surfaces that may have blood, body fluids, and/or secretions or excretions on them. Wear gloves when cleaning surfaces the patient has come in contact with. Use a diluted bleach solution (e.g., dilute bleach with 1 part bleach and 10 parts water) or a household  disinfectant with a label that says EPA-registered for coronaviruses. To make a bleach solution at home, add 1 tablespoon of bleach to 1 quart (4 cups) of water. For a larger supply, add  cup of bleach to 1 gallon (16 cups) of water. Read labels of cleaning products and follow recommendations provided on product labels. Labels contain instructions for safe and effective use of the cleaning product including precautions you should take when applying the product, such as  wearing gloves or eye protection and making sure you have good ventilation during use of the product. Remove gloves and wash hands immediately after cleaning.  Monitor yourself for signs and symptoms of illness Caregivers and household members are considered close contacts, should monitor their health, and will be asked to limit movement outside of the home to the extent possible. Follow the monitoring steps for close contacts listed on the symptom monitoring form.   ? If you have additional questions, contact your local health department or call the epidemiologist on call at [phone] (available 24/7). ? This guidance is subject to change. For the most up-to-date guidance from [facility], please refer to their website: [email or web address]

## 2019-05-27 NOTE — ED Notes (Signed)
UA and Culture obtained

## 2019-05-27 NOTE — ED Provider Notes (Addendum)
[facility] EMERGENCY DEPARTMENT Provider Note   CSN: [id number] Arrival date & time: 05/27/19  1402     History Chief Complaint  Patient presents with  . Chest Pain    COVID+    Wendy [name] is a 29 y.o. female presents emergency department with chief complaint of burning chest pain with cough.  She was diagnosed with Covid several days ago.  She has not been taking anything for cough.  She states that her cough is getting worse and now it burns and hurts when she takes a deep breath or coughs really hard.  She has had some nausea and has had some nonbloody nonbilious vomitus.  She took 1 naproxen yesterday to treat her symptoms.  Denies unilateral leg swelling, hemoptysis or pleuritic chest pain.  She has no history of DVT or PE.  HPI     Past Medical History:  Diagnosis Date  . Anxiety   . Panic attacks     There are no problems to display for this patient.   Past Surgical History:  Procedure Laterality Date  . TONSILLECTOMY    . WISDOM TOOTH EXTRACTION       OB History   No obstetric history on file.     No family history on file.  Social History   Tobacco Use  . Smoking status: Never Smoker  . Smokeless tobacco: Never Used  Substance Use Topics  . Alcohol use: Yes    Comment: Occasional  . Drug use: No    Home Medications Prior to Admission medications   Medication Sig Start Date End Date Taking? Authorizing Provider  azithromycin (ZITHROMAX) 250 MG tablet Take 1 tablet (250 mg total) by mouth daily. Take first 2 tablets together, then 1 every day until finished. 03/14/12   Elpidio Anis, PA-C  benzonatate (TESSALON) 100 MG capsule Take 2 capsules (200 mg total) by mouth 2 (two) times daily as needed for cough. 05/27/19   Arthor Captain, PA-C  ciprofloxacin-dexamethasone (CIPRODEX) otic suspension Place 4 drops into the right ear 2 (two) times daily. 02/19/15   Garlon Hatchet, PA-C  diazepam (VALIUM) 2 MG tablet Take 1 tablet (2 mg total)  by mouth every 8 (eight) hours as needed for muscle spasms. 12/09/17   Law, Waylan Boga, PA-C  HYDROcodone-homatropine (HYCODAN) 5-1.5 MG/5ML syrup Take 5 mLs by mouth every 6 (six) hours as needed for cough. 07/05/17   Gwyneth Sprout, MD  ibuprofen (ADVIL,MOTRIN) 600 MG tablet Take 1 tablet (600 mg total) by mouth every 6 (six) hours as needed. 12/09/17   Law, Waylan Boga, PA-C  medroxyPROGESTERone (DEPO-PROVERA) 150 MG/ML injection Inject 150 mg into the muscle every 3 (three) months.    [provider]  methylPREDNISolone (MEDROL DOSEPAK) 4 MG TBPK tablet Use as directed 05/27/19   Arthor Captain, PA-C  metoCLOPramide (REGLAN) 10 MG tablet Take 1 tablet (10 mg total) by mouth 3 (three) times daily as needed for nausea (headache / nausea). 05/27/19   Nilam Quakenbush, PA-C  ondansetron (ZOFRAN) 4 MG tablet Take 1 tablet (4 mg total) by mouth every 6 (six) hours. 02/28/13   Elpidio Anis, PA-C    Allergies    Penicillins  Review of Systems   Review of Systems Ten systems reviewed and are negative for acute change, except as noted in the HPI.   Physical Exam Updated Vital Signs BP 113/76 (BP Location: Left Arm)   Pulse 99   Temp 99.4 F (37.4 C) (Oral)   Resp 18  Ht 5\' 3"  (1.6 m)   Wt 81.6 kg   LMP 05/25/2019   SpO2 96%   BMI 31.89 kg/m   Physical Exam Vitals and nursing note reviewed.  Constitutional:      General: She is not in acute distress.    Appearance: She is well-developed. She is ill-appearing. She is not diaphoretic.  HENT:     Head: Normocephalic and atraumatic.  Eyes:     General: No scleral icterus.    Conjunctiva/sclera: Conjunctivae normal.  Cardiovascular:     Rate and Rhythm: Normal rate and regular rhythm.     Heart sounds: Normal heart sounds. No murmur. No friction rub. No gallop.   Pulmonary:     Effort: Pulmonary effort is normal. No respiratory distress.     Breath sounds: Normal breath sounds.     Comments: Hacking cough  Abdominal:      General: Bowel sounds are normal. There is no distension.     Palpations: Abdomen is soft. There is no mass.     Tenderness: There is no abdominal tenderness. There is no guarding.  Musculoskeletal:     Cervical back: Normal range of motion.  Skin:    General: Skin is warm and dry.  Neurological:     Mental Status: She is alert and oriented to person, place, and time.  Psychiatric:        Mood and Affect: Mood is anxious.        Behavior: Behavior normal.     ED Results / Procedures / Treatments   Labs (all labs ordered are listed, but only abnormal results are displayed) Labs Reviewed  COMPREHENSIVE METABOLIC PANEL - Abnormal; Notable for the following components:      Result Value   Potassium 3.4 (*)    Glucose, Bld 113 (*)    Calcium 8.6 (*)    AST 45 (*)    ALT 46 (*)    All other components within normal limits  CBC WITH DIFFERENTIAL/PLATELET - Abnormal; Notable for the following components:   Hemoglobin 11.6 (*)    Platelets 123 (*)    All other components within normal limits  PREGNANCY, URINE    EKG EKG Interpretation  Date/Time:  Thursday May 27 2019 14:09:48 EST Ventricular Rate:  123 PR Interval:  124 QRS Duration: 68 QT Interval:  322 QTC Calculation: 460 R Axis:   64 Text Interpretation: Sinus tachycardia Otherwise normal ECG Confirmed by 1985 [phone]) on 05/27/2019 2:23:34 PM   Radiology CT Angio Chest PE W and/or Wo Contrast  Result Date: 05/27/2019 CLINICAL DATA:  Chest pain. EXAM: CT ANGIOGRAPHY CHEST WITH CONTRAST TECHNIQUE: Multidetector CT imaging of the chest was performed using the standard protocol during bolus administration of intravenous contrast. Multiplanar CT image reconstructions and MIPs were obtained to evaluate the vascular anatomy. CONTRAST:  07/25/2019 OMNIPAQUE IOHEXOL 350 MG/ML SOLN COMPARISON:  None. FINDINGS: Cardiovascular: Satisfactory opacification of the pulmonary arteries to the segmental level. No evidence of  pulmonary embolism. Normal heart size. No pericardial effusion. Mediastinum/Nodes: No enlarged mediastinal, hilar, or axillary lymph nodes. Thyroid gland, trachea, and esophagus demonstrate no significant findings. Lungs/Pleura: Numerous small patchy infiltrates are seen within the bilateral lower lobes and inferior aspect of the left upper lobe. There is no evidence of a pleural effusion or pneumothorax. Upper Abdomen: No acute abnormality. Musculoskeletal: No chest wall abnormality. No acute or significant osseous findings. Review of the MIP images confirms the above findings. IMPRESSION: 1. No evidence of pulmonary embolus. 2.  Numerous small patchy infiltrates within the bilateral lower lobes and inferior aspect of the left upper lobe, suspicious for multifocal pneumonia. Electronically Signed   By: Virgina Norfolk M.D.   On: 05/27/2019 17:07   DG Chest Port 1 View  Result Date: 05/27/2019 CLINICAL DATA:  Shortness of breath, chest pain, COVID positive EXAM: PORTABLE CHEST 1 VIEW COMPARISON:  12/06/2017 FINDINGS: Patchy airspace disease in the left lower lung. Normal heart size. No pleural effusion. No pneumothorax. IMPRESSION: Patchy airspace disease in the left lower lung, suspect for pneumonia Electronically Signed   By: Donavan Foil M.D.   On: 05/27/2019 15:42    Procedures Procedures (including critical care time)  Medications Ordered in ED Medications  sodium chloride 0.9 % bolus 1,000 mL (0 mLs Intravenous Stopped 05/27/19 1828)  dexamethasone (DECADRON) injection 10 mg (10 mg Intravenous Given 05/27/19 1524)  albuterol (VENTOLIN HFA) 108 (90 Base) MCG/ACT inhaler 2 puff (2 puffs Inhalation Given 05/27/19 1514)  ibuprofen (ADVIL) tablet 400 mg (400 mg Oral Given 05/27/19 1524)  diphenhydrAMINE (BENADRYL) injection 25 mg (25 mg Intravenous Given 05/27/19 1525)  prochlorperazine (COMPAZINE) injection 10 mg (10 mg Intravenous Given 05/27/19 1531)  iohexol (OMNIPAQUE) 350 MG/ML injection 100 mL (100  mLs Intravenous Contrast Given 05/27/19 1658)    ED Course  I have reviewed the triage vital signs and the nursing notes.  Pertinent labs & imaging results that were available during my care of the patient were reviewed by me and considered in my medical decision making (see chart for details).    MDM Rules/Calculators/A&P                       Patient here withSOB/ She is COVID +.The emergent differential diagnosis for shortness of breath includes, but is not limited to, Pulmonary edema, bronchoconstriction, Pneumonia, Pulmonary embolism, Pneumotherax/ Hemothorax, Dysrythmia, ACS.  Tachycardic upon arrival. 02 sats low normal and improved significantly after treatment with decadron and albuterol. Patient seems to have a very low threshold for tolerance of her discomfort and is tearful. I have reviewed the patient's labs which shows mildly low platelet and hgb level, calcium and potassium And slightly elevated ast/alt, and glucose. All of insignificant value at this time. I personally reviewed the patient's plain film of the chest and CT angio which shows patchy airspace disease consistent with viral pneumonia. Patient's vitals have stabilized nicely with fluids and treatment. Her ekg shows sinus tachycardia. Patient will be discharged with symptomatic treatment. Return precautions given. Final Clinical Impression(s) / ED Diagnoses Final diagnoses:  Pneumonia due to COVID-19 virus    Rx / DC Orders ED Discharge Orders         Ordered    methylPREDNISolone (MEDROL DOSEPAK) 4 MG TBPK tablet     05/27/19 1812    metoCLOPramide (REGLAN) 10 MG tablet  3 times daily PRN     05/27/19 1812    benzonatate (TESSALON) 100 MG capsule  2 times daily PRN     05/27/19 1812           Margarita Mail, PA-C 05/29/19 0846    Malvin Johns, MD 06/09/19 1113    Margarita Mail, PA-C 06/09/19 1942    Malvin Johns, MD 06/11/19 1347

## 2019-09-16 ENCOUNTER — Emergency Department (HOSPITAL_BASED_OUTPATIENT_CLINIC_OR_DEPARTMENT_OTHER)
Admission: EM | Admit: 2019-09-16 | Discharge: 2019-09-16 | Disposition: A | Discharge status: Home / Self Care | Payer: No Typology Code available for payment source | Source: Home / Self Care | Attending: Emergency Medicine | Admitting: Emergency Medicine

## 2019-09-16 ENCOUNTER — Emergency Department (HOSPITAL_COMMUNITY): Payer: No Typology Code available for payment source

## 2019-09-16 ENCOUNTER — Other Ambulatory Visit: Payer: Self-pay

## 2019-09-16 ENCOUNTER — Encounter (HOSPITAL_BASED_OUTPATIENT_CLINIC_OR_DEPARTMENT_OTHER): Payer: Self-pay | Admitting: *Deleted

## 2019-09-16 ENCOUNTER — Emergency Department (HOSPITAL_BASED_OUTPATIENT_CLINIC_OR_DEPARTMENT_OTHER): Payer: No Typology Code available for payment source

## 2019-09-16 ENCOUNTER — Emergency Department (HOSPITAL_COMMUNITY)
Admission: EM | Admit: 2019-09-16 | Discharge: 2019-09-16 | Disposition: A | Discharge status: Home / Self Care | Payer: No Typology Code available for payment source | Attending: Emergency Medicine | Admitting: Emergency Medicine

## 2019-09-16 ENCOUNTER — Encounter (HOSPITAL_COMMUNITY): Payer: Self-pay | Admitting: Emergency Medicine

## 2019-09-16 DIAGNOSIS — M79604 Pain in right leg: Secondary | ICD-10-CM | POA: Diagnosis present

## 2019-09-16 DIAGNOSIS — S7011XA Contusion of right thigh, initial encounter: Secondary | ICD-10-CM

## 2019-09-16 DIAGNOSIS — Y990 Civilian activity done for income or pay: Secondary | ICD-10-CM | POA: Insufficient documentation

## 2019-09-16 DIAGNOSIS — M79651 Pain in right thigh: Secondary | ICD-10-CM

## 2019-09-16 DIAGNOSIS — Y9389 Activity, other specified: Secondary | ICD-10-CM | POA: Insufficient documentation

## 2019-09-16 DIAGNOSIS — Z5321 Procedure and treatment not carried out due to patient leaving prior to being seen by health care provider: Secondary | ICD-10-CM | POA: Diagnosis not present

## 2019-09-16 DIAGNOSIS — T1490XA Injury, unspecified, initial encounter: Secondary | ICD-10-CM

## 2019-09-16 DIAGNOSIS — Y92481 Parking lot as the place of occurrence of the external cause: Secondary | ICD-10-CM | POA: Insufficient documentation

## 2019-09-16 DIAGNOSIS — T148XXA Other injury of unspecified body region, initial encounter: Secondary | ICD-10-CM

## 2019-09-16 LAB — PREGNANCY, URINE: Preg Test, Ur: NEGATIVE

## 2019-09-16 MED ORDER — ONDANSETRON 4 MG PO TBDP
4.0000 mg | ORAL_TABLET | Freq: Once | ORAL | Status: AC
  Administered 2019-09-16: 4 mg via ORAL
  Filled 2019-09-16: qty 1

## 2019-09-16 MED ORDER — METHOCARBAMOL 500 MG PO TABS
500.0000 mg | ORAL_TABLET | Freq: Two times a day (BID) | ORAL | 0 refills | Status: DC
Start: 2019-09-16 — End: 2021-10-30

## 2019-09-16 MED ORDER — OXYCODONE-ACETAMINOPHEN 5-325 MG PO TABS
1.0000 | ORAL_TABLET | Freq: Once | ORAL | Status: AC
  Administered 2019-09-16: 20:00:00 1 via ORAL
  Filled 2019-09-16: qty 1
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
## 2019-09-16 NOTE — Discharge Instructions (Signed)
You have been diagnosed today with right thigh pain and contusion.  At this time there does not appear to be the presence of an emergent medical condition, however there is always the potential for conditions to change. Please read and follow the below instructions.  Please return to the Emergency Department immediately for any new or worsening symptoms. Please be sure to follow up with your Primary Care Provider within one week regarding your visit today; please call their office to schedule an appointment even if you are feeling better for a follow-up visit. Please check plenty of water and get plenty of rest.  You may use ice, rest and elevation to help with your pain. You were given pain medication today called oxycodone, this will make you drowsy.  Do not drive or drink alcohol or perform any dangerous activities for the rest the day. You may use the muscle relaxer Robaxin as prescribed to help with your symptoms.  Do not drive or operate heavy machinery while taking Robaxin as it will make you drowsy.  Do not drink alcohol or take other sedating medications while taking Robaxin as this will worsen side effects.   Get help right away if: You have: Loss of feeling (numbness), tingling, or weakness in your arms or legs. Very bad neck pain, especially tenderness in the middle of the back of your neck. A change in your ability to control your pee or poop (stool). More pain in any area of your body. Swelling in any area of your body, especially your legs. Shortness of breath or light-headedness. Chest pain. Blood in your pee, poop, or vomit. Very bad pain in your belly (abdomen) or your back. Very bad headaches or headaches that are getting worse. Sudden vision loss or double vision. Your eye suddenly turns red. The black center of your eye (pupil) is an odd shape or size. You have very bad pain. You have a loss of feeling (numbness) in a hand or foot. Your hand or foot turns pale or  cold. You have any new/concerning or worsening of symptoms.  Please read the additional information packets attached to your discharge summary.  Do not take your medicine if  develop an itchy rash, swelling in your mouth or lips, or difficulty breathing; call 911 and seek immediate emergency medical attention if this occurs.  Note: Portions of this text may have been transcribed using voice recognition software. Every effort was made to ensure accuracy; however, inadvertent computerized transcription errors may still be present.
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
## 2020-04-12 ENCOUNTER — Other Ambulatory Visit: Payer: Self-pay

## 2020-04-12 ENCOUNTER — Emergency Department (HOSPITAL_BASED_OUTPATIENT_CLINIC_OR_DEPARTMENT_OTHER)
Admission: EM | Admit: 2020-04-12 | Discharge: 2020-04-12 | Disposition: A | Discharge status: Home / Self Care | Payer: Self-pay | Attending: Emergency Medicine | Admitting: Emergency Medicine

## 2020-04-12 ENCOUNTER — Encounter (HOSPITAL_BASED_OUTPATIENT_CLINIC_OR_DEPARTMENT_OTHER): Payer: Self-pay | Admitting: *Deleted

## 2020-04-12 DIAGNOSIS — R0981 Nasal congestion: Secondary | ICD-10-CM | POA: Insufficient documentation

## 2020-04-12 DIAGNOSIS — J019 Acute sinusitis, unspecified: Secondary | ICD-10-CM | POA: Insufficient documentation

## 2020-04-12 MED ORDER — PSEUDOEPHEDRINE HCL 30 MG PO TABS: 30.0000 mg | ORAL_TABLET | Freq: Four times a day (QID) | ORAL | 0 refills | Status: DC | PRN

## 2020-04-12 NOTE — ED Provider Notes (Signed)
[facility] EMERGENCY DEPARTMENT Provider Note   CSN: [id number] Arrival date & time: 04/12/20  1113     History Chief Complaint  Patient presents with  . congestion    Wendy [name] is a 29 y.o. female.  The history is provided by the patient.  URI Presenting symptoms: congestion   Presenting symptoms: no cough, no ear pain, no fever and no sore throat   Severity:  Mild Onset quality:  Gradual Timing:  Constant Progression:  Unchanged Chronicity:  New Relieved by:  Nothing Worsened by:  Nothing Ineffective treatments:  OTC medications Associated symptoms: sinus pain   Associated symptoms: no arthralgias, no headaches, no myalgias, no neck pain and no swollen glands        Past Medical History:  Diagnosis Date  . Anxiety   . Panic attacks     There are no problems to display for this patient.   Past Surgical History:  Procedure Laterality Date  . TONSILLECTOMY    . WISDOM TOOTH EXTRACTION       OB History   No obstetric history on file.     History reviewed. No pertinent family history.  Social History   Tobacco Use  . Smoking status: Never Smoker  . Smokeless tobacco: Never Used  Substance Use Topics  . Alcohol use: Yes    Comment: Occasional  . Drug use: No    Home Medications Prior to Admission medications   Medication Sig Start Date End Date Taking? Authorizing Provider  azithromycin (ZITHROMAX) 250 MG tablet Take 1 tablet (250 mg total) by mouth daily. Take first 2 tablets together, then 1 every day until finished. 03/14/12   Elpidio Anis, PA-C  benzonatate (TESSALON) 100 MG capsule Take 2 capsules (200 mg total) by mouth 2 (two) times daily as needed for cough. 05/27/19   Arthor Captain, PA-C  ciprofloxacin-dexamethasone (CIPRODEX) otic suspension Place 4 drops into the right ear 2 (two) times daily. 02/19/15   Garlon Hatchet, PA-C  diazepam (VALIUM) 2 MG tablet Take 1 tablet (2 mg total) by mouth every 8 (eight) hours as  needed for muscle spasms. 12/09/17   Law, Waylan Boga, PA-C  HYDROcodone-homatropine (HYCODAN) 5-1.5 MG/5ML syrup Take 5 mLs by mouth every 6 (six) hours as needed for cough. 07/05/17   Gwyneth Sprout, MD  ibuprofen (ADVIL,MOTRIN) 600 MG tablet Take 1 tablet (600 mg total) by mouth every 6 (six) hours as needed. 12/09/17   Law, Waylan Boga, PA-C  medroxyPROGESTERone (DEPO-PROVERA) 150 MG/ML injection Inject 150 mg into the muscle every 3 (three) months.    [provider]  methocarbamol (ROBAXIN) 500 MG tablet Take 1 tablet (500 mg total) by mouth 2 (two) times daily. 09/16/19   Bill Salinas, PA-C  methylPREDNISolone (MEDROL DOSEPAK) 4 MG TBPK tablet Use as directed 05/27/19   Arthor Captain, PA-C  metoCLOPramide (REGLAN) 10 MG tablet Take 1 tablet (10 mg total) by mouth 3 (three) times daily as needed for nausea (headache / nausea). 05/27/19   Harris, Abigail, PA-C  ondansetron (ZOFRAN) 4 MG tablet Take 1 tablet (4 mg total) by mouth every 6 (six) hours. 02/28/13   Elpidio Anis, PA-C  pseudoephedrine (SUDAFED) 30 MG tablet Take 1 tablet (30 mg total) by mouth every 6 (six) hours as needed for up to 25 doses for congestion. 04/12/20   Emillia Weatherly, DO    Allergies    Penicillins  Review of Systems   Review of Systems  Constitutional: Negative for chills and  fever.  HENT: Positive for congestion and sinus pain. Negative for ear pain and sore throat.   Eyes: Negative for pain and visual disturbance.  Respiratory: Negative for cough and shortness of breath.   Cardiovascular: Negative for chest pain and palpitations.  Gastrointestinal: Negative for abdominal pain and vomiting.  Genitourinary: Negative for dysuria and hematuria.  Musculoskeletal: Negative for arthralgias, back pain, myalgias and neck pain.  Skin: Negative for color change and rash.  Neurological: Negative for seizures, syncope and headaches.  All other systems reviewed and are negative.   Physical Exam Updated  Vital Signs BP (!) 132/94 (BP Location: Right Arm)   Pulse 80   Temp 98.3 F (36.8 C) (Oral)   Resp 20   Ht 5\' 1"  (1.549 m)   Wt 79.8 kg   SpO2 98%   BMI 33.25 kg/m   Physical Exam Vitals and nursing note reviewed.  Constitutional:      General: She is not in acute distress.    Appearance: She is well-developed.  HENT:     Head: Normocephalic and atraumatic.     Nose: Congestion present. No rhinorrhea.     Mouth/Throat:     Pharynx: No oropharyngeal exudate or posterior oropharyngeal erythema.  Eyes:     Extraocular Movements: Extraocular movements intact.     Conjunctiva/sclera: Conjunctivae normal.     Pupils: Pupils are equal, round, and reactive to light.  Cardiovascular:     Rate and Rhythm: Normal rate and regular rhythm.     Heart sounds: No murmur heard.   Pulmonary:     Effort: Pulmonary effort is normal. No respiratory distress.     Breath sounds: Normal breath sounds.  Abdominal:     Palpations: Abdomen is soft.     Tenderness: There is no abdominal tenderness.  Musculoskeletal:     Cervical back: Neck supple.  Skin:    General: Skin is warm and dry.  Neurological:     Mental Status: She is alert.     ED Results / Procedures / Treatments   Labs (all labs ordered are listed, but only abnormal results are displayed) Labs Reviewed - No data to display  EKG None  Radiology No results found.  Procedures Procedures (including critical care time)  Medications Ordered in ED Medications - No data to display  ED Course  I have reviewed the triage vital signs and the nursing notes.  Pertinent labs & imaging results that were available during my care of the patient were reviewed by me and considered in my medical decision making (see chart for details).    MDM Rules/Calculators/A&P                          Wendy [name] is here for nasal congestion. Normal vitals. No fever. Has nasal congestion on exam. Having a lot of sinus pressure. No fever,  no mucousy discharge. Overall suspect sinus congestion from viral process or allergic process. Has been tried some over-the-counter medications without much relief. States that she does not like using Afrin or other nasal sprays. Therefore will prescribe Sudafed. No fever, no mucousy discharge and no concern for a true sinus infection at this time will hold on antibiotics and have her follow-up with her primary care doctor. Given return precautions and discharged from the ED in good condition.  This chart was dictated using voice recognition software.  Despite best efforts to proofread,  errors can occur which can change the  documentation meaning.    Final Clinical Impression(s) / ED Diagnoses Final diagnoses:  Nasal congestion    Rx / DC Orders ED Discharge Orders         Ordered    pseudoephedrine (SUDAFED) 30 MG tablet  Every 6 hours PRN        04/12/20 1130           Virgina Norfolk, DO 04/12/20 1132

## 2020-04-12 NOTE — ED Triage Notes (Signed)
Presents with nasal congestion since this past Monday, intermittent cough, non productive, no fevers, has tried DayQuill and NightQuill, intermittent HA

## 2021-01-24 ENCOUNTER — Emergency Department (HOSPITAL_BASED_OUTPATIENT_CLINIC_OR_DEPARTMENT_OTHER)
Admission: EM | Admit: 2021-01-24 | Discharge: 2021-01-24 | Disposition: A | Discharge status: Home / Self Care | Payer: No Typology Code available for payment source | Attending: Emergency Medicine | Admitting: Emergency Medicine

## 2021-01-24 ENCOUNTER — Other Ambulatory Visit: Payer: Self-pay

## 2021-01-24 ENCOUNTER — Encounter (HOSPITAL_BASED_OUTPATIENT_CLINIC_OR_DEPARTMENT_OTHER): Payer: Self-pay | Admitting: *Deleted

## 2021-01-24 ENCOUNTER — Emergency Department (HOSPITAL_BASED_OUTPATIENT_CLINIC_OR_DEPARTMENT_OTHER): Payer: No Typology Code available for payment source

## 2021-01-24 DIAGNOSIS — Y990 Civilian activity done for income or pay: Secondary | ICD-10-CM | POA: Insufficient documentation

## 2021-01-24 DIAGNOSIS — S99922A Unspecified injury of left foot, initial encounter: Secondary | ICD-10-CM | POA: Diagnosis present

## 2021-01-24 DIAGNOSIS — S9032XA Contusion of left foot, initial encounter: Secondary | ICD-10-CM | POA: Diagnosis not present

## 2021-01-24 DIAGNOSIS — W208XXA Other cause of strike by thrown, projected or falling object, initial encounter: Secondary | ICD-10-CM | POA: Insufficient documentation

## 2021-01-24 NOTE — Discharge Instructions (Addendum)
Please use crutches over the next couple of days given you are having pain with walking While at home please rest, ice, and elevate your foot to help reduce pain/swelling Take Ibuprofen and Tylenol as needed for pain  Follow up with your PCP regarding ED visit  Return to the ED for any new/worsening symptoms

## 2021-01-24 NOTE — ED Triage Notes (Signed)
C/o left foot injury today with swelling and pain

## 2021-01-24 NOTE — ED Provider Notes (Signed)
[facility] EMERGENCY DEPARTMENT Provider Note   CSN: [id number] Arrival date & time: 01/24/21  2135     History Chief Complaint  Patient presents with   Foot Injury    Wendy [name] is a 30 y.o. female who presents to the ED today with complaint of sudden onset, constant, sharp/throbbing, left foot pain status post injury that occurred earlier today around 4 PM.  Patient was wearing sandals at work helping her dad put in a new door when the door accidentally fell landing directly onto her foot and causing immediate pain.  She states that she took ibuprofen earlier without relief.  She has significant pain with ambulating on the foot.  She is also noticed swelling and bruising to the area.  She has no other complaints at this time.   The history is provided by the patient and medical records.      Past Medical History:  Diagnosis Date   Anxiety    Panic attacks     There are no problems to display for this patient.   Past Surgical History:  Procedure Laterality Date   TONSILLECTOMY     WISDOM TOOTH EXTRACTION       OB History   No obstetric history on file.     No family history on file.  Social History   Tobacco Use   Smoking status: Never   Smokeless tobacco: Never  Substance Use Topics   Alcohol use: Yes    Comment: Occasional   Drug use: No    Home Medications Prior to Admission medications   Medication Sig Start Date End Date Taking? Authorizing Provider  azithromycin (ZITHROMAX) 250 MG tablet Take 1 tablet (250 mg total) by mouth daily. Take first 2 tablets together, then 1 every day until finished. 03/14/12   Elpidio Anis, PA-C  benzonatate (TESSALON) 100 MG capsule Take 2 capsules (200 mg total) by mouth 2 (two) times daily as needed for cough. 05/27/19   Arthor Captain, PA-C  ciprofloxacin-dexamethasone (CIPRODEX) otic suspension Place 4 drops into the right ear 2 (two) times daily. 02/19/15   Garlon Hatchet, PA-C  diazepam (VALIUM) 2 MG  tablet Take 1 tablet (2 mg total) by mouth every 8 (eight) hours as needed for muscle spasms. 12/09/17   Law, Waylan Boga, PA-C  HYDROcodone-homatropine (HYCODAN) 5-1.5 MG/5ML syrup Take 5 mLs by mouth every 6 (six) hours as needed for cough. 07/05/17   Gwyneth Sprout, MD  ibuprofen (ADVIL,MOTRIN) 600 MG tablet Take 1 tablet (600 mg total) by mouth every 6 (six) hours as needed. 12/09/17   Law, Waylan Boga, PA-C  medroxyPROGESTERone (DEPO-PROVERA) 150 MG/ML injection Inject 150 mg into the muscle every 3 (three) months.    [provider]  methocarbamol (ROBAXIN) 500 MG tablet Take 1 tablet (500 mg total) by mouth 2 (two) times daily. 09/16/19   Bill Salinas, PA-C  methylPREDNISolone (MEDROL DOSEPAK) 4 MG TBPK tablet Use as directed 05/27/19   Arthor Captain, PA-C  metoCLOPramide (REGLAN) 10 MG tablet Take 1 tablet (10 mg total) by mouth 3 (three) times daily as needed for nausea (headache / nausea). 05/27/19   Harris, Abigail, PA-C  ondansetron (ZOFRAN) 4 MG tablet Take 1 tablet (4 mg total) by mouth every 6 (six) hours. 02/28/13   Elpidio Anis, PA-C  pseudoephedrine (SUDAFED) 30 MG tablet Take 1 tablet (30 mg total) by mouth every 6 (six) hours as needed for up to 25 doses for congestion. 04/12/20   Virgina Norfolk, DO  Allergies    Penicillins  Review of Systems   Review of Systems  Constitutional:  Negative for chills and fever.  Musculoskeletal:  Positive for arthralgias and joint swelling.  Skin:  Positive for color change (bruising). Negative for wound.  All other systems reviewed and are negative.  Physical Exam Updated Vital Signs BP (!) 136/92   Pulse 91   Temp 98.7 F (37.1 C) (Oral)   Resp 16   Ht 5\' 1"  (1.549 m)   Wt 94.3 kg   LMP 12/26/2020   SpO2 100%   BMI 39.30 kg/m   Physical Exam Vitals and nursing note reviewed.  Constitutional:      Appearance: She is not ill-appearing.  HENT:     Head: Normocephalic and atraumatic.  Eyes:      Conjunctiva/sclera: Conjunctivae normal.  Cardiovascular:     Rate and Rhythm: Normal rate and regular rhythm.  Pulmonary:     Effort: Pulmonary effort is normal.     Breath sounds: Normal breath sounds.  Musculoskeletal:     Comments: Swelling and ecchymosis noted to the dorsal aspect of the L foot along 3rd-5th metacarpals distally with TTP. Cap refill < 2 seconds to all toes. ROM intact to ankle without TTP. ROM intact to great toe. Pt able to wiggle 2nd-5th toes however worsening pain with same. 2+ DP pulse.   Skin:    General: Skin is warm and dry.     Coloration: Skin is not jaundiced.  Neurological:     Mental Status: She is alert.    ED Results / Procedures / Treatments   Labs (all labs ordered are listed, but only abnormal results are displayed) Labs Reviewed - No data to display  EKG None  Radiology DG Foot Complete Left  Result Date: 01/24/2021 CLINICAL DATA:  Foot injury, crushing injury EXAM: LEFT FOOT - COMPLETE 3+ VIEW COMPARISON:  None. FINDINGS: Soft tissue swelling along the dorsum of the foot. No acute bony abnormality. Specifically, no fracture, subluxation, or dislocation. IMPRESSION: No acute bony abnormality. Electronically Signed   By: 03/26/2021 M.D.   On: 01/24/2021 22:12    Procedures Procedures   Medications Ordered in ED Medications - No data to display  ED Course  I have reviewed the triage vital signs and the nursing notes.  Pertinent labs & imaging results that were available during my care of the patient were reviewed by me and considered in my medical decision making (see chart for details).    MDM Rules/Calculators/A&P                           30 year old female who presents to the ED today with complaint of sudden onset left foot pain status post injury that occurred earlier today when a door fell on same.  On arrival to the ED today vitals are stable and patient peers to be in no acute distress.  She is noted to have swelling and  ecchymosis to the dorsal aspect of the left foot along the lateral metacarpals tenderness to palpation to same.  We will plan for x-ray at this time for further evaluation.   Xray negative for fracture. Pt provided with crutches in the ED. RICE therapy has been discussed with pt. She is encouraged to take Ibuprofen and Tylenol as needed for pain. Pt advised to follow up with PCP for same. Stable for discharge home.   This note was prepared using 34  and may include unintentional dictation errors due to the inherent limitations of voice recognition software.   Final Clinical Impression(s) / ED Diagnoses Final diagnoses:  Contusion of left foot, initial encounter    Rx / DC Orders ED Discharge Orders     None        Discharge Instructions      Please use crutches over the next couple of days given you are having pain with walking While at home please rest, ice, and elevate your foot to help reduce pain/swelling Take Ibuprofen and Tylenol as needed for pain  Follow up with your PCP regarding ED visit  Return to the ED for any new/worsening symptoms       Tanda Rockers, PA-C 01/24/21 2229    Maia Plan, MD 02/04/21 1747

## 2021-01-28 ENCOUNTER — Encounter (HOSPITAL_BASED_OUTPATIENT_CLINIC_OR_DEPARTMENT_OTHER): Payer: Self-pay | Admitting: Emergency Medicine

## 2021-01-28 ENCOUNTER — Emergency Department (HOSPITAL_BASED_OUTPATIENT_CLINIC_OR_DEPARTMENT_OTHER)
Admission: EM | Admit: 2021-01-28 | Discharge: 2021-01-28 | Disposition: A | Discharge status: Home / Self Care | Payer: No Typology Code available for payment source | Attending: Emergency Medicine | Admitting: Emergency Medicine

## 2021-01-28 ENCOUNTER — Other Ambulatory Visit: Payer: Self-pay

## 2021-01-28 DIAGNOSIS — W208XXA Other cause of strike by thrown, projected or falling object, initial encounter: Secondary | ICD-10-CM | POA: Diagnosis not present

## 2021-01-28 DIAGNOSIS — S9032XA Contusion of left foot, initial encounter: Secondary | ICD-10-CM | POA: Insufficient documentation

## 2021-01-28 DIAGNOSIS — S99922A Unspecified injury of left foot, initial encounter: Secondary | ICD-10-CM | POA: Diagnosis present

## 2021-01-28 MED ORDER — HYDROCODONE-ACETAMINOPHEN 5-325 MG PO TABS: 1.0000 | ORAL_TABLET | ORAL | 0 refills | Status: DC | PRN

## 2021-01-28 MED ORDER — IBUPROFEN 800 MG PO TABS
800.0000 mg | ORAL_TABLET | Freq: Three times a day (TID) | ORAL | 0 refills | Status: DC | PRN
Start: 2021-01-28 — End: 2021-05-29

## 2021-01-28 MED ORDER — HYDROCODONE-ACETAMINOPHEN 5-325 MG PO TABS
1.0000 | ORAL_TABLET | Freq: Once | ORAL | Status: AC
  Administered 2021-01-28: 1 via ORAL
  Filled 2021-01-28: qty 1

## 2021-01-28 NOTE — ED Triage Notes (Signed)
Reports left foot pain continues since having a door dropped on it on 9/7.  Has not followed up with PCP.  Taking ibuprofen and tylenol.

## 2021-01-28 NOTE — ED Provider Notes (Signed)
[facility] EMERGENCY DEPARTMENT Provider Note   CSN: [id number] Arrival date & time: 01/28/21  2044     History Chief Complaint  Patient presents with   Foot Swelling    Wendy [name] is a 30 y.o. female.  Pt reports a door fell on her foot.  Pt reports she has been elevating foot but it is bruising and swelling more. Pt reports increased pain.   The history is provided by the patient. No language interpreter was used.  Foot Pain This is a new problem. Episode onset: 4 days. The problem occurs constantly. Nothing aggravates the symptoms. Nothing relieves the symptoms. She has tried nothing for the symptoms. The treatment provided no relief.      Past Medical History:  Diagnosis Date   Anxiety    Panic attacks     There are no problems to display for this patient.   Past Surgical History:  Procedure Laterality Date   TONSILLECTOMY     WISDOM TOOTH EXTRACTION       OB History   No obstetric history on file.     No family history on file.  Social History   Tobacco Use   Smoking status: Never   Smokeless tobacco: Never  Substance Use Topics   Alcohol use: Yes    Comment: Occasional   Drug use: No    Home Medications Prior to Admission medications   Medication Sig Start Date End Date Taking? Authorizing Provider  azithromycin (ZITHROMAX) 250 MG tablet Take 1 tablet (250 mg total) by mouth daily. Take first 2 tablets together, then 1 every day until finished. 03/14/12   Elpidio Anis, PA-C  benzonatate (TESSALON) 100 MG capsule Take 2 capsules (200 mg total) by mouth 2 (two) times daily as needed for cough. 05/27/19   Arthor Captain, PA-C  ciprofloxacin-dexamethasone (CIPRODEX) otic suspension Place 4 drops into the right ear 2 (two) times daily. 02/19/15   Garlon Hatchet, PA-C  diazepam (VALIUM) 2 MG tablet Take 1 tablet (2 mg total) by mouth every 8 (eight) hours as needed for muscle spasms. 12/09/17   Law, Waylan Boga, PA-C   HYDROcodone-homatropine (HYCODAN) 5-1.5 MG/5ML syrup Take 5 mLs by mouth every 6 (six) hours as needed for cough. 07/05/17   Gwyneth Sprout, MD  ibuprofen (ADVIL,MOTRIN) 600 MG tablet Take 1 tablet (600 mg total) by mouth every 6 (six) hours as needed. 12/09/17   Law, Waylan Boga, PA-C  medroxyPROGESTERone (DEPO-PROVERA) 150 MG/ML injection Inject 150 mg into the muscle every 3 (three) months.    [provider]  methocarbamol (ROBAXIN) 500 MG tablet Take 1 tablet (500 mg total) by mouth 2 (two) times daily. 09/16/19   Bill Salinas, PA-C  methylPREDNISolone (MEDROL DOSEPAK) 4 MG TBPK tablet Use as directed 05/27/19   Arthor Captain, PA-C  metoCLOPramide (REGLAN) 10 MG tablet Take 1 tablet (10 mg total) by mouth 3 (three) times daily as needed for nausea (headache / nausea). 05/27/19   Harris, Abigail, PA-C  ondansetron (ZOFRAN) 4 MG tablet Take 1 tablet (4 mg total) by mouth every 6 (six) hours. 02/28/13   Elpidio Anis, PA-C  pseudoephedrine (SUDAFED) 30 MG tablet Take 1 tablet (30 mg total) by mouth every 6 (six) hours as needed for up to 25 doses for congestion. 04/12/20   Virgina Norfolk, DO    Allergies    Penicillins  Review of Systems   Review of Systems  Musculoskeletal:  Positive for myalgias.  All other systems reviewed and  are negative.  Physical Exam Updated Vital Signs BP (!) 116/92 (BP Location: Right Arm)   Pulse 93   Temp 98.8 F (37.1 C) (Oral)   Resp 20   Ht 5\' 1"  (1.549 m)   Wt 92.5 kg   LMP 01/28/2021   SpO2 97%   BMI 38.55 kg/m   Physical Exam Vitals reviewed.  Constitutional:      Appearance: Normal appearance.  Musculoskeletal:        General: Swelling and tenderness present.     Comments: Left foot, bruised swollen  tender dorsal foot. Nv intact   Skin:    General: Skin is warm.     Findings: Bruising present.  Neurological:     General: No focal deficit present.     Mental Status: She is alert.  Psychiatric:        Mood and Affect:  Mood normal.    ED Results / Procedures / Treatments   Labs (all labs ordered are listed, but only abnormal results are displayed) Labs Reviewed - No data to display  EKG None  Radiology No results found.  Procedures Procedures   Medications Ordered in ED Medications - No data to display  ED Course  I have reviewed the triage vital signs and the nursing notes.  Pertinent labs & imaging results that were available during my care of the patient were reviewed by me and considered in my medical decision making (see chart for details).    MDM Rules/Calculators/A&P                           MDM:  Pt had an xray 4 days ago.  No fx.  I counseled pt on hematoma and spreading.  Pt placed in a cam walker.  Pt advised to continue elevation.  Follow up with Dr. 03/30/2021 Sports medicine for recheck.   Final Clinical Impression(s) / ED Diagnoses Final diagnoses:  Contusion of left foot, initial encounter  Hematoma of left foot    Rx / DC Orders ED Discharge Orders          Ordered    HYDROcodone-acetaminophen (NORCO/VICODIN) 5-325 MG tablet  Every 4 hours PRN        01/28/21 2256    ibuprofen (ADVIL) 800 MG tablet  Every 8 hours PRN        01/28/21 2256          An After Visit Summary was printed and given to the patient.    03/30/21, Elson Areas 01/28/21 2258    2259, MD 01/29/21 1301

## 2021-01-28 NOTE — Discharge Instructions (Addendum)
Elevate foot,  pain medication and ibuprofen.

## 2021-01-30 ENCOUNTER — Ambulatory Visit: Payer: Self-pay

## 2021-01-30 ENCOUNTER — Encounter: Payer: Self-pay | Admitting: Family Medicine

## 2021-01-30 ENCOUNTER — Ambulatory Visit (INDEPENDENT_AMBULATORY_CARE_PROVIDER_SITE_OTHER): Payer: No Typology Code available for payment source | Admitting: Family Medicine

## 2021-01-30 ENCOUNTER — Other Ambulatory Visit: Payer: Self-pay

## 2021-01-30 VITALS — Ht 61.0 in | Wt 206.0 lb

## 2021-01-30 DIAGNOSIS — M79672 Pain in left foot: Secondary | ICD-10-CM

## 2021-01-30 DIAGNOSIS — S9032XA Contusion of left foot, initial encounter: Secondary | ICD-10-CM

## 2021-01-30 DIAGNOSIS — S93622D Sprain of tarsometatarsal ligament of left foot, subsequent encounter: Secondary | ICD-10-CM | POA: Insufficient documentation

## 2021-01-30 DIAGNOSIS — S9030XA Contusion of unspecified foot, initial encounter: Secondary | ICD-10-CM | POA: Insufficient documentation

## 2021-01-30 NOTE — Progress Notes (Signed)
Wendy [name] - 30 y.o. female MRN [id number]  Date of birth: 1990/07/05  SUBJECTIVE:  Including CC & ROS.  No chief complaint on file.   Wendy [name] is a 30 y.o. female that is presenting with acute left foot pain.  She had a door fall on her foot while she was at work.  Having significant pain and swelling and bruising over the dorsum of the foot.  Pain is severe does not have exacerbation intermittently.  Has been using the cam walker  Independent review left foot x-ray from 9/7 shows no acute bony changes.   Review of Systems See HPI   HISTORY: Past Medical, Surgical, Social, and Family History Reviewed & Updated per EMR.   Pertinent Historical Findings include:  Past Medical History:  Diagnosis Date   Anxiety    Panic attacks     Past Surgical History:  Procedure Laterality Date   TONSILLECTOMY     WISDOM TOOTH EXTRACTION      History reviewed. No pertinent family history.  Social History   Socioeconomic History   Marital status: Single    Spouse name: Not on file   Number of children: Not on file   Years of education: Not on file   Highest education level: Not on file  Occupational History   Not on file  Tobacco Use   Smoking status: Never   Smokeless tobacco: Never  Substance and Sexual Activity   Alcohol use: Yes    Comment: Occasional   Drug use: No   Sexual activity: Not on file  Other Topics Concern   Not on file  Social History Narrative   Not on file   Social Determinants of Health   Financial Resource Strain: Not on file  Food Insecurity: Not on file  Transportation Needs: Not on file  Physical Activity: Not on file  Stress: Not on file  Social Connections: Not on file  Intimate Partner Violence: Not on file     PHYSICAL EXAM:  VS: Ht 5\' 1"  (1.549 m)   Wt 206 lb (93.4 kg)   LMP 01/28/2021   BMI 38.92 kg/m  Physical Exam Gen: NAD, alert, cooperative with exam, well-appearing   Limited ultrasound: Left foot  Imaging is  significant in the dorsum of the forefoot. No changes of the cortex of the metatarsals. Normal-appearing Lisfranc joint. Normal insertional peroneal brevis. Normal-appearing peroneal tendons at the lateral malleolus  Summary: Soft tissue swelling and hematoma in the dorsal forefoot  Ultrasound and interpretation by Clare Gandy, MD    ASSESSMENT & PLAN:   Hematoma of left foot Injury occurred while at work on 9/7.  Has soft tissue changes and no bony abnormalities. -Counseled on limiting weightbearing to no weightbearing. -Counseled on home exercise therapy and supportive care. -Rolling knee scooter. -Could consider further imaging. -Provided work note.

## 2021-01-30 NOTE — Assessment & Plan Note (Signed)
Injury occurred while at work on 9/7.  Has soft tissue changes and no bony abnormalities. -Counseled on limiting weightbearing to no weightbearing. -Counseled on home exercise therapy and supportive care. -Rolling knee scooter. -Could consider further imaging. -Provided work note.

## 2021-01-30 NOTE — Patient Instructions (Addendum)
Nice to meet you Please elevate and ice your foot  Please use the scooter   Please continue the ibuprofen  Please send me a message in MyChart with any questions or updates.  Please see me back in 2 weeks.   --Dr. Jordan Likes

## 2021-02-14 ENCOUNTER — Other Ambulatory Visit: Payer: Self-pay

## 2021-02-14 ENCOUNTER — Encounter: Payer: Self-pay | Admitting: Family Medicine

## 2021-02-14 ENCOUNTER — Ambulatory Visit (INDEPENDENT_AMBULATORY_CARE_PROVIDER_SITE_OTHER): Payer: No Typology Code available for payment source | Admitting: Family Medicine

## 2021-02-14 ENCOUNTER — Ambulatory Visit (HOSPITAL_BASED_OUTPATIENT_CLINIC_OR_DEPARTMENT_OTHER)
Admission: RE | Admit: 2021-02-14 | Discharge: 2021-02-14 | Disposition: A | Discharge status: Home / Self Care | Payer: No Typology Code available for payment source | Source: Ambulatory Visit | Attending: Family Medicine | Admitting: Family Medicine

## 2021-02-14 VITALS — Ht 61.0 in | Wt 206.0 lb

## 2021-02-14 DIAGNOSIS — S9032XA Contusion of left foot, initial encounter: Secondary | ICD-10-CM | POA: Diagnosis not present

## 2021-02-14 NOTE — Progress Notes (Signed)
Wendy [name] - 30 y.o. female MRN [id number]  Date of birth: 11/23/1990  SUBJECTIVE:  Including CC & ROS.  No chief complaint on file.   Wendy [name] is a 30 y.o. female that is following up after injury sustained at work.  She is still having pain on the lateral aspect of her foot.  It is tender to touch.    Review of Systems See HPI   HISTORY: Past Medical, Surgical, Social, and Family History Reviewed & Updated per EMR.   Pertinent Historical Findings include:  Past Medical History:  Diagnosis Date   Anxiety    Panic attacks     Past Surgical History:  Procedure Laterality Date   TONSILLECTOMY     WISDOM TOOTH EXTRACTION      History reviewed. No pertinent family history.  Social History   Socioeconomic History   Marital status: Single    Spouse name: Not on file   Number of children: Not on file   Years of education: Not on file   Highest education level: Not on file  Occupational History   Not on file  Tobacco Use   Smoking status: Never   Smokeless tobacco: Never  Substance and Sexual Activity   Alcohol use: Yes    Comment: Occasional   Drug use: No   Sexual activity: Not on file  Other Topics Concern   Not on file  Social History Narrative   Not on file   Social Determinants of Health   Financial Resource Strain: Not on file  Food Insecurity: Not on file  Transportation Needs: Not on file  Physical Activity: Not on file  Stress: Not on file  Social Connections: Not on file  Intimate Partner Violence: Not on file     PHYSICAL EXAM:  VS: Ht 5\' 1"  (1.549 m)   Wt 206 lb (93.4 kg)   LMP 01/28/2021   BMI 38.92 kg/m  Physical Exam Gen: NAD, alert, cooperative with exam, well-appearing      ASSESSMENT & PLAN:   Hematoma of left foot Following up from the injury she sustained at work.  Is acutely painful today. -Counseled on home exercise therapy and supportive care. -X-ray. -Referral to physical therapy. -Counseled on topical  application of medicine. -Could consider further imaging -Counseled on weaning out of the boot.

## 2021-02-14 NOTE — Patient Instructions (Signed)
Good to see you Please try physical therapy  Please try to wean out of the boot  I will call with the results from today   Please send me a message in MyChart with any questions or updates.  Please see me back in 2 weeks.   --Dr. Jordan Likes

## 2021-02-14 NOTE — Assessment & Plan Note (Signed)
Following up from the injury she sustained at work.  Is acutely painful today. -Counseled on home exercise therapy and supportive care. -X-ray. -Referral to physical therapy. -Counseled on topical application of medicine. -Could consider further imaging -Counseled on weaning out of the boot.

## 2021-02-16 ENCOUNTER — Telehealth: Payer: Self-pay | Admitting: Family Medicine

## 2021-02-16 DIAGNOSIS — S9032XA Contusion of left foot, initial encounter: Secondary | ICD-10-CM

## 2021-02-16 NOTE — Telephone Encounter (Signed)
Left VM for patient. If she calls back please have her speak with a nurse/CMA and inform that her x-ray shows a possible change at the Lisfranc ligament.  If she has never had a previous foot injury we may need to proceed with an MRI of the foot..   If any questions then please take the best time and phone number to call and I will try to call her back.   Myra Rude, MD [facility] 02/16/2021, 9:06 AM

## 2021-02-16 NOTE — Telephone Encounter (Signed)
Pt informed of below.  She needs MRI order faxed to [facility]. Order placed and faxed to [facility] @ [phone].

## 2021-02-26 ENCOUNTER — Ambulatory Visit (INDEPENDENT_AMBULATORY_CARE_PROVIDER_SITE_OTHER): Payer: No Typology Code available for payment source | Admitting: Family Medicine

## 2021-02-26 ENCOUNTER — Encounter: Payer: Self-pay | Admitting: Family Medicine

## 2021-02-26 DIAGNOSIS — S93622D Sprain of tarsometatarsal ligament of left foot, subsequent encounter: Secondary | ICD-10-CM | POA: Diagnosis not present

## 2021-02-26 NOTE — Patient Instructions (Signed)
Good to see you Please continue the boot   Please send me a message in MyChart with any questions or updates.  We will setup a visit once the MRI is resulted.   --Dr. Jordan Likes

## 2021-02-26 NOTE — Progress Notes (Signed)
[name] - 30 y.o. female MRN [id number]  Date of birth: Jan 01, 1991  SUBJECTIVE:  Including CC & ROS.  No chief complaint on file.   Wendy [name] is a 30 y.o. female that is following up for her left foot pain after an injury she sustained at work.  Recent x-ray was showing concern for Lisfranc injury.  She did have trauma where a door landed on that area of her foot.   Review of Systems See HPI   HISTORY: Past Medical, Surgical, Social, and Family History Reviewed & Updated per EMR.   Pertinent Historical Findings include:  Past Medical History:  Diagnosis Date   Anxiety    Panic attacks     Past Surgical History:  Procedure Laterality Date   TONSILLECTOMY     WISDOM TOOTH EXTRACTION      History reviewed. No pertinent family history.  Social History   Socioeconomic History   Marital status: Single    Spouse name: Not on file   Number of children: Not on file   Years of education: Not on file   Highest education level: Not on file  Occupational History   Not on file  Tobacco Use   Smoking status: Never   Smokeless tobacco: Never  Substance and Sexual Activity   Alcohol use: Yes    Comment: Occasional   Drug use: No   Sexual activity: Not on file  Other Topics Concern   Not on file  Social History Narrative   Not on file   Social Determinants of Health   Financial Resource Strain: Not on file  Food Insecurity: Not on file  Transportation Needs: Not on file  Physical Activity: Not on file  Stress: Not on file  Social Connections: Not on file  Intimate Partner Violence: Not on file     PHYSICAL EXAM:  VS: Ht 5\' 1"  (1.549 m)   Wt 206 lb (93.4 kg)   LMP 01/29/2021   BMI 38.92 kg/m  Physical Exam Gen: NAD, alert, cooperative with exam, well-appearing      ASSESSMENT & PLAN:   Lisfranc's sprain, left, subsequent encounter Initial injury occurred at work.  Recent x-ray showing changes concerning for Lisfranc injury. -Counseled on home  exercise therapy and supportive care. -Continue cam walker. -Provided work note. -Continue to get MRI to evaluate for Lisfranc injury.

## 2021-02-26 NOTE — Assessment & Plan Note (Signed)
Initial injury occurred at work.  Recent x-ray showing changes concerning for Lisfranc injury. -Counseled on home exercise therapy and supportive care. -Continue cam walker. -Provided work note. -Continue to get MRI to evaluate for Lisfranc injury.

## 2021-02-27 ENCOUNTER — Emergency Department (HOSPITAL_BASED_OUTPATIENT_CLINIC_OR_DEPARTMENT_OTHER)
Admission: EM | Admit: 2021-02-27 | Discharge: 2021-02-28 | Disposition: A | Discharge status: Home / Self Care | Payer: 59 | Attending: Emergency Medicine | Admitting: Emergency Medicine

## 2021-02-27 ENCOUNTER — Other Ambulatory Visit: Payer: Self-pay

## 2021-02-27 ENCOUNTER — Emergency Department (HOSPITAL_BASED_OUTPATIENT_CLINIC_OR_DEPARTMENT_OTHER): Payer: 59

## 2021-02-27 ENCOUNTER — Encounter (HOSPITAL_BASED_OUTPATIENT_CLINIC_OR_DEPARTMENT_OTHER): Payer: Self-pay

## 2021-02-27 DIAGNOSIS — R519 Headache, unspecified: Secondary | ICD-10-CM | POA: Diagnosis not present

## 2021-02-27 DIAGNOSIS — S29011A Strain of muscle and tendon of front wall of thorax, initial encounter: Secondary | ICD-10-CM | POA: Insufficient documentation

## 2021-02-27 DIAGNOSIS — S96911A Strain of unspecified muscle and tendon at ankle and foot level, right foot, initial encounter: Secondary | ICD-10-CM | POA: Insufficient documentation

## 2021-02-27 DIAGNOSIS — N9489 Other specified conditions associated with female genital organs and menstrual cycle: Secondary | ICD-10-CM | POA: Insufficient documentation

## 2021-02-27 DIAGNOSIS — R1084 Generalized abdominal pain: Secondary | ICD-10-CM | POA: Insufficient documentation

## 2021-02-27 DIAGNOSIS — S66912A Strain of unspecified muscle, fascia and tendon at wrist and hand level, left hand, initial encounter: Secondary | ICD-10-CM | POA: Insufficient documentation

## 2021-02-27 DIAGNOSIS — R109 Unspecified abdominal pain: Secondary | ICD-10-CM

## 2021-02-27 DIAGNOSIS — S6992XA Unspecified injury of left wrist, hand and finger(s), initial encounter: Secondary | ICD-10-CM | POA: Diagnosis present

## 2021-02-27 DIAGNOSIS — Y92410 Unspecified street and highway as the place of occurrence of the external cause: Secondary | ICD-10-CM | POA: Insufficient documentation

## 2021-02-27 DIAGNOSIS — R079 Chest pain, unspecified: Secondary | ICD-10-CM

## 2021-02-27 LAB — CBC WITH DIFFERENTIAL/PLATELET
Abs Immature Granulocytes: 0.06 10*3/uL (ref 0.00–0.07)
Basophils Absolute: 0.1 10*3/uL (ref 0.0–0.1)
Basophils Relative: 0 %
Eosinophils Absolute: 0.1 10*3/uL (ref 0.0–0.5)
Eosinophils Relative: 0 %
HCT: 36.8 % (ref 36.0–46.0)
Hemoglobin: 11.9 g/dL — ABNORMAL LOW (ref 12.0–15.0)
Immature Granulocytes: 0 %
Lymphocytes Relative: 10 %
Lymphs Abs: 1.8 10*3/uL (ref 0.7–4.0)
MCH: 26.4 pg (ref 26.0–34.0)
MCHC: 32.3 g/dL (ref 30.0–36.0)
MCV: 81.6 fL (ref 80.0–100.0)
Monocytes Absolute: 1 10*3/uL (ref 0.1–1.0)
Monocytes Relative: 5 %
Neutro Abs: 15.5 10*3/uL — ABNORMAL HIGH (ref 1.7–7.7)
Neutrophils Relative %: 85 %
Platelets: 331 10*3/uL (ref 150–400)
RBC: 4.51 MIL/uL (ref 3.87–5.11)
RDW: 15.3 % (ref 11.5–15.5)
WBC: 18.4 10*3/uL — ABNORMAL HIGH (ref 4.0–10.5)
nRBC: 0 % (ref 0.0–0.2)

## 2021-02-27 LAB — BASIC METABOLIC PANEL
Anion gap: 9 (ref 5–15)
BUN: 8 mg/dL (ref 6–20)
CO2: 22 mmol/L (ref 22–32)
Calcium: 9.1 mg/dL (ref 8.9–10.3)
Chloride: 104 mmol/L (ref 98–111)
Creatinine, Ser: 0.61 mg/dL (ref 0.44–1.00)
GFR, Estimated: >60 mL/min (ref >60)
Glucose, Bld: 122 mg/dL — ABNORMAL HIGH (ref 70–99)
Potassium: 3.6 mmol/L (ref 3.5–5.1)
Sodium: 135 mmol/L (ref 135–145)

## 2021-02-27 LAB — HCG, SERUM, QUALITATIVE: Preg, Serum: NEGATIVE

## 2021-02-27 MED ORDER — FENTANYL CITRATE PF 50 MCG/ML IJ SOSY
50.0000 ug | PREFILLED_SYRINGE | Freq: Once | INTRAMUSCULAR | Status: AC
  Administered 2021-02-27: 50 ug via INTRAVENOUS
  Filled 2021-02-27: qty 1

## 2021-02-27 MED ORDER — DIPHENHYDRAMINE HCL 50 MG/ML IJ SOLN
12.5000 mg | Freq: Once | INTRAMUSCULAR | Status: AC
  Administered 2021-02-27: 12.5 mg via INTRAVENOUS
  Filled 2021-02-27: qty 1

## 2021-02-27 MED ORDER — METHOCARBAMOL 500 MG PO TABS: 500.0000 mg | ORAL_TABLET | Freq: Three times a day (TID) | ORAL | 0 refills | Status: DC | PRN

## 2021-02-27 MED ORDER — OXYCODONE-ACETAMINOPHEN 5-325 MG PO TABS: 1.0000 | ORAL_TABLET | Freq: Four times a day (QID) | ORAL | 0 refills | Status: AC | PRN

## 2021-02-27 MED ORDER — PROCHLORPERAZINE EDISYLATE 10 MG/2ML IJ SOLN
5.0000 mg | Freq: Once | INTRAMUSCULAR | Status: AC
  Administered 2021-02-27: 5 mg via INTRAVENOUS
  Filled 2021-02-27: qty 2

## 2021-02-27 MED ORDER — IOHEXOL 300 MG/ML  SOLN
100.0000 mL | Freq: Once | INTRAMUSCULAR | Status: AC | PRN
  Administered 2021-02-27: 100 mL via INTRAVENOUS

## 2021-02-27 NOTE — Discharge Instructions (Addendum)
Recommend taking anti-inflammatory such as Motrin or naproxen as well as Tylenol and muscle relaxer for pain control.  For breakthrough pain can take the prescribed Percocet needed.  Note this can make you drowsy should not be taken when driving or operating heavy machinery.
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
## 2021-02-28 ENCOUNTER — Ambulatory Visit: Payer: 59 | Admitting: Family Medicine

## 2021-03-06 ENCOUNTER — Ambulatory Visit: Payer: 59 | Admitting: Family Medicine

## 2021-03-15 ENCOUNTER — Telehealth: Payer: Self-pay | Admitting: Family Medicine

## 2021-03-15 NOTE — Telephone Encounter (Signed)
Patient called states WC did scheduled her MRI till 03/26/21, her Work notes end 03/19/21 and her next appt w/ provider isn't until 04/04/21 (can work note be extended till next OV ) since she hasn't had the MRI yet.  --Forwarding request to provider for review.  -glh

## 2021-03-15 NOTE — Telephone Encounter (Signed)
Provided work note.   Myra Rude, MD [facility] 03/15/2021, 2:29 PM

## 2021-03-15 NOTE — Telephone Encounter (Signed)
Updated OOW created in Letters tab for 03/27/21 per patient request. Patient will access OOW note via MyChart.

## 2021-03-27 ENCOUNTER — Ambulatory Visit (INDEPENDENT_AMBULATORY_CARE_PROVIDER_SITE_OTHER): Payer: No Typology Code available for payment source | Admitting: Family Medicine

## 2021-03-27 ENCOUNTER — Encounter: Payer: Self-pay | Admitting: Family Medicine

## 2021-03-27 VITALS — BP 130/86 | Ht 61.0 in | Wt 206.0 lb

## 2021-03-27 DIAGNOSIS — S93622D Sprain of tarsometatarsal ligament of left foot, subsequent encounter: Secondary | ICD-10-CM | POA: Diagnosis not present

## 2021-03-27 NOTE — Assessment & Plan Note (Signed)
Initial injury on 9/7.  The MRI did not include the Lisfranc ligament or joint when it was performed.  We will have to repeat the imaging to include this anatomy. -Counseled on home exercise therapy and supportive care. -Provided work note. -Continue cam walker. -MRI of the foot to evaluate the Lisfranc joint and midfoot.

## 2021-03-27 NOTE — Progress Notes (Signed)
Wendy [name] - 30 y.o. female MRN [id number]  Date of birth: 02/22/1991  SUBJECTIVE:  Including CC & ROS.  No chief complaint on file.   Wendy [name] is a 30 y.o. female that is following up for her injury she sustained at work.  Her left foot continues to hurt.  Unfortunately the MRI that was performed did not show the Lisfranc joint where she sustained her injur   Review of Systems See HPI   HISTORY: Past Medical, Surgical, Social, and Family History Reviewed & Updated per EMR.   Pertinent Historical Findings include:  Past Medical History:  Diagnosis Date   Anxiety    Panic attacks     Past Surgical History:  Procedure Laterality Date   TONSILLECTOMY     WISDOM TOOTH EXTRACTION      History reviewed. No pertinent family history.  Social History   Socioeconomic History   Marital status: Single    Spouse name: Not on file   Number of children: Not on file   Years of education: Not on file   Highest education level: Not on file  Occupational History   Not on file  Tobacco Use   Smoking status: Never   Smokeless tobacco: Never  Substance and Sexual Activity   Alcohol use: Yes    Comment: Occasional   Drug use: No   Sexual activity: Not on file  Other Topics Concern   Not on file  Social History Narrative   Not on file   Social Determinants of Health   Financial Resource Strain: Not on file  Food Insecurity: Not on file  Transportation Needs: Not on file  Physical Activity: Not on file  Stress: Not on file  Social Connections: Not on file  Intimate Partner Violence: Not on file     PHYSICAL EXAM:  VS: BP 130/86 (BP Location: Left Arm, Patient Position: Sitting)   Ht 5\' 1"  (1.549 m)   Wt 206 lb (93.4 kg)   LMP 02/27/2021 (Exact Date) Comment: Negative preg test in Ed today  BMI 38.92 kg/m  Physical Exam Gen: NAD, alert, cooperative with exam, well-appearing     ASSESSMENT & PLAN:   Lisfranc's sprain, left, subsequent encounter Initial  injury on 9/7.  The MRI did not include the Lisfranc ligament or joint when it was performed.  We will have to repeat the imaging to include this anatomy. -Counseled on home exercise therapy and supportive care. -Provided work note. -Continue cam walker. -MRI of the foot to evaluate the Lisfranc joint and midfoot.

## 2021-03-27 NOTE — Patient Instructions (Signed)
Good to see you  Please continue the boot  Please send me a message in MyChart with any questions or updates.  We'll setup a visit once we get the MRI results.   --Dr. Jordan Likes

## 2021-04-04 ENCOUNTER — Ambulatory Visit: Payer: 59 | Admitting: Family Medicine

## 2021-04-16 ENCOUNTER — Telehealth: Payer: Self-pay | Admitting: Family Medicine

## 2021-04-16 NOTE — Telephone Encounter (Signed)
Patient called states she needs her work note extending until after 2nd MRI performed & results evaluated.  ---Forwarding request to med asst for review w/ Dr.Schmitz .  --glh

## 2021-04-17 NOTE — Telephone Encounter (Signed)
Provided work note.   Myra Rude, MD [facility] 04/17/2021, 4:12 PM

## 2021-05-01 ENCOUNTER — Encounter: Payer: Self-pay | Admitting: Family Medicine

## 2021-05-01 ENCOUNTER — Ambulatory Visit (INDEPENDENT_AMBULATORY_CARE_PROVIDER_SITE_OTHER): Payer: No Typology Code available for payment source | Admitting: Family Medicine

## 2021-05-01 VITALS — BP 148/92 | Ht 61.0 in | Wt 206.0 lb

## 2021-05-01 DIAGNOSIS — S9032XD Contusion of left foot, subsequent encounter: Secondary | ICD-10-CM | POA: Diagnosis not present

## 2021-05-01 NOTE — Progress Notes (Signed)
[name] - 30 y.o. female MRN [id number]  Date of birth: November 22, 1990  SUBJECTIVE:  Including CC & ROS.  No chief complaint on file.   Wendy [name] is a 30 y.o. female that is following up for MRI of her left foot.  The MRI was showing no structural changes on exam.  Her initial injury occurred at work.    Review of Systems See HPI   HISTORY: Past Medical, Surgical, Social, and Family History Reviewed & Updated per EMR.   Pertinent Historical Findings include:  Past Medical History:  Diagnosis Date   Anxiety    Panic attacks     Past Surgical History:  Procedure Laterality Date   TONSILLECTOMY     WISDOM TOOTH EXTRACTION      History reviewed. No pertinent family history.  Social History   Socioeconomic History   Marital status: Single    Spouse name: Not on file   Number of children: Not on file   Years of education: Not on file   Highest education level: Not on file  Occupational History   Not on file  Tobacco Use   Smoking status: Never   Smokeless tobacco: Never  Substance and Sexual Activity   Alcohol use: Yes    Comment: Occasional   Drug use: No   Sexual activity: Not on file  Other Topics Concern   Not on file  Social History Narrative   Not on file   Social Determinants of Health   Financial Resource Strain: Not on file  Food Insecurity: Not on file  Transportation Needs: Not on file  Physical Activity: Not on file  Stress: Not on file  Social Connections: Not on file  Intimate Partner Violence: Not on file     PHYSICAL EXAM:  VS: BP (!) 148/92 (BP Location: Left Arm, Patient Position: Sitting)    Ht 5\' 1"  (1.549 m)    Wt 206 lb (93.4 kg)    BMI 38.92 kg/m  Physical Exam Gen: NAD, alert, cooperative with exam, well-appearing    ASSESSMENT & PLAN:   Foot contusion Initial injury occurred at work on 9/7.  MRI of the left foot was demonstrating no structural changes. -Counseled on home exercise therapy and supportive  care. -Referral to physical therapy. -Counseled on transitioning on CAM Walker. -Provided work note.

## 2021-05-01 NOTE — Patient Instructions (Signed)
Good to see you Please stop the boot  Please try physical therapy   Please send me a message in MyChart with any questions or updates.  Please see me back in 4 weeks.   --Dr. Jordan Likes

## 2021-05-01 NOTE — Assessment & Plan Note (Signed)
Initial injury occurred at work on 9/7.  MRI of the left foot was demonstrating no structural changes. -Counseled on home exercise therapy and supportive care. -Referral to physical therapy. -Counseled on transitioning on CAM Walker. -Provided work note.

## 2021-05-29 ENCOUNTER — Encounter: Payer: Self-pay | Admitting: Family Medicine

## 2021-05-29 ENCOUNTER — Ambulatory Visit (INDEPENDENT_AMBULATORY_CARE_PROVIDER_SITE_OTHER): Payer: No Typology Code available for payment source | Admitting: Family Medicine

## 2021-05-29 VITALS — BP 138/98 | Ht 61.0 in | Wt 206.0 lb

## 2021-05-29 DIAGNOSIS — S9032XD Contusion of left foot, subsequent encounter: Secondary | ICD-10-CM | POA: Diagnosis not present

## 2021-05-29 MED ORDER — MELOXICAM 7.5 MG PO TABS: 7.5000 mg | ORAL_TABLET | Freq: Two times a day (BID) | ORAL | 1 refills | Status: DC | PRN

## 2021-05-29 NOTE — Progress Notes (Signed)
SARIN VERRETT - 31 y.o. female MRN [id number]  Date of birth: 1991-03-25  SUBJECTIVE:  Including CC & ROS.  No chief complaint on file.   ABBRIELLA ACKERS is a 31 y.o. female that is following up for her left foot pain.  This was an injury she sustained at work.  She continues to have swelling and pain after using the foot.  She has not been back to work.  No color changes of the foot.    Review of Systems See HPI   HISTORY: Past Medical, Surgical, Social, and Family History Reviewed & Updated per EMR.   Pertinent Historical Findings include:  Past Medical History:  Diagnosis Date   Anxiety    Panic attacks     Past Surgical History:  Procedure Laterality Date   TONSILLECTOMY     WISDOM TOOTH EXTRACTION       PHYSICAL EXAM:  VS: BP (!) 138/98 (BP Location: Left Arm, Patient Position: Sitting)    Ht 5\' 1"  (1.549 m)    Wt 206 lb (93.4 kg)    BMI 38.92 kg/m  Physical Exam Gen: NAD, alert, cooperative with exam, well-appearing MSK: Neurovascularly intact       ASSESSMENT & PLAN:   Foot contusion Has pain intermittently and swelling.  This accompanies physical therapy he is getting more active within physical therapy.  Has not been back to work after her injury was sustained at work. -Counseled on home exercise therapy and supportive care. -Mobic. -Provided work note. -Could consider FCE

## 2021-05-29 NOTE — Assessment & Plan Note (Signed)
Has pain intermittently and swelling.  This accompanies physical therapy he is getting more active within physical therapy.  Has not been back to work after her injury was sustained at work. -Counseled on home exercise therapy and supportive care. -Mobic. -Provided work note. -Could consider FCE

## 2021-05-29 NOTE — Patient Instructions (Signed)
Good to see you Please try to see how you do at work   Please take the mobic as needed Please send me a message in MyChart with any questions or updates.  Please see me back in 4 weeks.   --Dr. Jordan Likes

## 2021-05-30 ENCOUNTER — Encounter: Payer: Self-pay | Admitting: Family Medicine

## 2021-05-31 ENCOUNTER — Ambulatory Visit (INDEPENDENT_AMBULATORY_CARE_PROVIDER_SITE_OTHER): Payer: No Typology Code available for payment source | Admitting: Family Medicine

## 2021-05-31 ENCOUNTER — Encounter: Payer: Self-pay | Admitting: Family Medicine

## 2021-05-31 DIAGNOSIS — R202 Paresthesia of skin: Secondary | ICD-10-CM | POA: Insufficient documentation

## 2021-05-31 DIAGNOSIS — M7989 Other specified soft tissue disorders: Secondary | ICD-10-CM

## 2021-05-31 NOTE — Assessment & Plan Note (Signed)
Started back to work and having ongoing swelling and ulcer sensation of the foot and toes.  Stemming from her initial injury while at work. -Counseled on home exercise therapy and supportive care. -Referral to neurology for consideration of nerve study. -Provided work note. -Counseled on Mobic.

## 2021-05-31 NOTE — Progress Notes (Signed)
Wendy [name] - 31 y.o. female MRN [id number]  Date of birth: 04/03/1991  SUBJECTIVE:  Including CC & ROS.  No chief complaint on file.   Wendy [name] is a 32 y.o. female that is  here for exacerbating for left foot pain. Having swelling and paresthesia of her foot since going back to work. Unable to get the foot to move when she was vacuuming.    Review of Systems See HPI   HISTORY: Past Medical, Surgical, Social, and Family History Reviewed & Updated per EMR.   Pertinent Historical Findings include:  Past Medical History:  Diagnosis Date   Anxiety    Panic attacks     Past Surgical History:  Procedure Laterality Date   TONSILLECTOMY     WISDOM TOOTH EXTRACTION       PHYSICAL EXAM:  VS: BP 138/88 (BP Location: Left Arm, Patient Position: Sitting)    Ht 5\' 1"  (1.549 m)    Wt 206 lb (93.4 kg)    BMI 38.92 kg/m  Physical Exam Gen: NAD, alert, cooperative with exam, well-appearing MSK:  Neurovascularly intact       ASSESSMENT & PLAN:   Swelling of left foot Started back to work and having ongoing swelling and ulcer sensation of the foot and toes.  Stemming from her initial injury while at work. -Counseled on home exercise therapy and supportive care. -Referral to neurology for consideration of nerve study. -Provided work note. -Counseled on Mobic.

## 2021-05-31 NOTE — Patient Instructions (Signed)
Good to see you Please try ice  Please try the mobic  We've made a referral to neurology   Please send me a message in MyChart with any questions or updates.  Please see me back in 4 weeks or after the visit with the neurologist.   --Dr. Raeford Razor

## 2021-06-26 ENCOUNTER — Ambulatory Visit (INDEPENDENT_AMBULATORY_CARE_PROVIDER_SITE_OTHER): Payer: No Typology Code available for payment source | Admitting: Family Medicine

## 2021-06-26 ENCOUNTER — Encounter: Payer: Self-pay | Admitting: Family Medicine

## 2021-06-26 VITALS — BP 120/84 | Ht 61.0 in | Wt 206.0 lb

## 2021-06-26 DIAGNOSIS — R202 Paresthesia of skin: Secondary | ICD-10-CM

## 2021-06-26 NOTE — Patient Instructions (Signed)
Good to see you We'll put a hold on physical therapy   Please send me a message in MyChart with any questions or updates.  We'll follow up once the neurology evaluation is complete.   --Dr. Jordan Likes

## 2021-06-26 NOTE — Progress Notes (Signed)
Wendy [name] - 31 y.o. female MRN [id number]  Date of birth: 12/22/1990  SUBJECTIVE:  Including CC & ROS.  No chief complaint on file.   Wendy [name] is a 31 y.o. female that is following up for her left foot numbness following an injury she sustained at work.  She reports intermittent symptoms of paresthesias.  It occurs over the dorsum of the foot.  It will occur between the great foot as well as the lesser digits.  No swelling today or color changes.   Review of Systems See HPI   HISTORY: Past Medical, Surgical, Social, and Family History Reviewed & Updated per EMR.   Pertinent Historical Findings include:  Past Medical History:  Diagnosis Date   Anxiety    Panic attacks     Past Surgical History:  Procedure Laterality Date   TONSILLECTOMY     WISDOM TOOTH EXTRACTION       PHYSICAL EXAM:  VS: BP 120/84 (BP Location: Left Arm, Patient Position: Sitting)    Ht 5\' 1"  (1.549 m)    Wt 206 lb (93.4 kg)    BMI 38.92 kg/m  Physical Exam Gen: NAD, alert, cooperative with exam, well-appearing MSK:  Neurovascularly intact       ASSESSMENT & PLAN:   Paresthesia of left foot Initial injury on 9/13 following an injury she sustained at work.  Imaging has been reassuring.  Having intermittent paresthesia and loss of function of her toes. -Counseled on home exercise therapy and supportive care. -Still awaiting neurology evaluation for a nerve study. -Place hold on physical therapy -Provided work note

## 2021-06-26 NOTE — Assessment & Plan Note (Signed)
Initial injury on 9/13 following an injury she sustained at work.  Imaging has been reassuring.  Having intermittent paresthesia and loss of function of her toes. -Counseled on home exercise therapy and supportive care. -Still awaiting neurology evaluation for a nerve study. -Place hold on physical therapy -Provided work note

## 2021-07-24 ENCOUNTER — Encounter: Payer: Self-pay | Admitting: Family Medicine

## 2021-08-20 ENCOUNTER — Encounter: Payer: Self-pay | Admitting: Family Medicine

## 2021-08-29 ENCOUNTER — Encounter: Payer: Self-pay | Admitting: Family Medicine

## 2021-09-19 ENCOUNTER — Encounter: Payer: Self-pay | Admitting: *Deleted

## 2021-09-26 ENCOUNTER — Ambulatory Visit: Payer: 59 | Admitting: Family Medicine

## 2021-10-02 ENCOUNTER — Ambulatory Visit (INDEPENDENT_AMBULATORY_CARE_PROVIDER_SITE_OTHER): Payer: No Typology Code available for payment source | Admitting: Family Medicine

## 2021-10-02 ENCOUNTER — Encounter: Payer: Self-pay | Admitting: Family Medicine

## 2021-10-02 VITALS — BP 140/88 | Ht 61.0 in | Wt 206.0 lb

## 2021-10-02 DIAGNOSIS — R202 Paresthesia of skin: Secondary | ICD-10-CM | POA: Diagnosis not present

## 2021-10-02 NOTE — Progress Notes (Signed)
?  Wendy [name] - 31 y.o. female MRN [id number]  Date of birth: 03/21/1991 ? ?SUBJECTIVE:  Including CC & ROS.  ?No chief complaint on file. ? ? ?Wendy [name] is a 31 y.o. female that is following up for her left foot pain following an injury at work.  She was seen by neurology who suggested a contusion to the deep peroneal nerve.  She continues to have pain and did not tolerate gabapentin.  A nerve study has not been completed yet.  ? ? ?Review of Systems ?See HPI  ? ?HISTORY: Past Medical, Surgical, Social, and Family History Reviewed & Updated per EMR.   ?Pertinent Historical Findings include: ? ?Past Medical History:  ?Diagnosis Date  ? Anxiety   ? Panic attacks   ? ? ?Past Surgical History:  ?Procedure Laterality Date  ? TONSILLECTOMY    ? WISDOM TOOTH EXTRACTION    ? ? ? ?PHYSICAL EXAM:  ?VS: BP 140/88 (BP Location: Left Arm, Patient Position: Sitting)   Ht 5\' 1"  (1.549 m)   Wt 206 lb (93.4 kg)   BMI 38.92 kg/m?  ?Physical Exam ?Gen: NAD, alert, cooperative with exam, well-appearing ?MSK: ?Neurovascularly intact   ? ? ? ? ?ASSESSMENT & PLAN:  ? ?Paresthesia of left foot ?Acute on chronic in nature after an injury at work.  She continues to have pain.  Did not tolerate recent gabapentin. ?-Counseled on home exercise therapy and supportive care. ?-Pursue a nerve block of the knee peroneal nerve. ?-Can still pursue the nerve study. ?-Provided work note. ? ? ? ? ?

## 2021-10-02 NOTE — Patient Instructions (Signed)
Good to see you ?I can send valium in before we perform the nerve block  ?Please send me a message in MyChart with any questions or updates.  ?Please see me back to have the nerve block performed .  ? ?--Dr. Raeford Razor ? ?

## 2021-10-02 NOTE — Assessment & Plan Note (Signed)
Acute on chronic in nature after an injury at work.  She continues to have pain.  Did not tolerate recent gabapentin. ?-Counseled on home exercise therapy and supportive care. ?-Pursue a nerve block of the knee peroneal nerve. ?-Can still pursue the nerve study. ?-Provided work note. ?

## 2021-10-23 ENCOUNTER — Ambulatory Visit: Payer: 59 | Admitting: Family Medicine

## 2021-10-30 ENCOUNTER — Ambulatory Visit (INDEPENDENT_AMBULATORY_CARE_PROVIDER_SITE_OTHER): Payer: No Typology Code available for payment source | Admitting: Family Medicine

## 2021-10-30 ENCOUNTER — Encounter: Payer: Self-pay | Admitting: Family Medicine

## 2021-10-30 VITALS — BP 120/90 | Ht 61.0 in | Wt 206.0 lb

## 2021-10-30 DIAGNOSIS — R202 Paresthesia of skin: Secondary | ICD-10-CM | POA: Diagnosis not present

## 2021-10-30 NOTE — Patient Instructions (Signed)
Good to see you We will send you for an functional capacity exam  Please send me a message in MyChart with any questions or updates.  Please see me back when the exam is finished.   --Dr. Jordan Likes

## 2021-10-30 NOTE — Assessment & Plan Note (Signed)
Acute on chronic in nature.  Occurring after an injury at work.  X-ray, MRI and nerve study have all been normal.  She continues to experience pain and paresthesia in the foot.  Was intolerant to gabapentin has recently been started on topiramate by the neurologist -Counseled on home exercise therapy and supportive care. -Referral for functional capacity exam

## 2021-10-30 NOTE — Progress Notes (Signed)
Wendy [name] - 31 y.o. female MRN [id number]  Date of birth: [birth year]  SUBJECTIVE:  Including CC & ROS.  No chief complaint on file.   Wendy [name] is a 31 y.o. female that is following up for her feet pain following an injury at work.  The recent nerve study was normal.  She was recently started on topiramate.  She continues to have problems with motion and pain.   Review of Systems See HPI   HISTORY: Past Medical, Surgical, Social, and Family History Reviewed & Updated per EMR.   Pertinent Historical Findings include:  Past Medical History:  Diagnosis Date   Anxiety    Panic attacks     Past Surgical History:  Procedure Laterality Date   TONSILLECTOMY     WISDOM TOOTH EXTRACTION       PHYSICAL EXAM:  VS: BP 120/90 (BP Location: Left Arm, Patient Position: Sitting)   Ht 5\' 1"  (1.549 m)   Wt 206 lb (93.4 kg)   BMI 38.92 kg/m  Physical Exam Gen: NAD, alert, cooperative with exam, well-appearing MSK:  Neurovascularly intact       ASSESSMENT & PLAN:   Paresthesia of left foot Acute on chronic in nature.  Occurring after an injury at work.  X-ray, MRI and nerve study have all been normal.  She continues to experience pain and paresthesia in the foot.  Was intolerant to gabapentin has recently been started on topiramate by the neurologist -Counseled on home exercise therapy and supportive care. -Referral for functional capacity exam

## 2021-12-17 ENCOUNTER — Ambulatory Visit (INDEPENDENT_AMBULATORY_CARE_PROVIDER_SITE_OTHER): Payer: No Typology Code available for payment source | Admitting: Family Medicine

## 2021-12-17 ENCOUNTER — Encounter: Payer: Self-pay | Admitting: Family Medicine

## 2021-12-17 VITALS — BP 120/70 | Ht 61.0 in | Wt 206.0 lb

## 2021-12-17 DIAGNOSIS — R202 Paresthesia of skin: Secondary | ICD-10-CM | POA: Diagnosis not present

## 2021-12-17 NOTE — Progress Notes (Signed)
Wendy [name] - 31 y.o. female MRN [id number]  Date of birth: 1990-06-19  SUBJECTIVE:  Including CC & ROS.  No chief complaint on file.   Wendy [name] is a 31 y.o. female that is following up for her left foot changes after an injury she sustained at work.  She recently had a functional capacity exam which showed that she has the ability to perform sedentary with abilities into the light physical demand occasionally.  She did demonstrated the ability to lift 15 pounds from the floor to 60 inches.  She reported the inability to attempt frequent lifting.  Her nonmaterial landing activities were completed with complaints of left ankle/foot sharp stabbing pain and reported numbness in her toes.  She self terminated walking, standing, repetitive bending and repetitive kneeling/crouching postures due to reported pain and weakness.  Stairclimbing was not assessed for safety concerns..   Review of Systems See HPI   HISTORY: Past Medical, Surgical, Social, and Family History Reviewed & Updated per EMR.   Pertinent Historical Findings include:  Past Medical History:  Diagnosis Date   Anxiety    Panic attacks     Past Surgical History:  Procedure Laterality Date   TONSILLECTOMY     WISDOM TOOTH EXTRACTION       PHYSICAL EXAM:  VS: BP 120/70 (BP Location: Left Arm, Patient Position: Sitting)   Ht 5\' 1"  (1.549 m)   Wt 206 lb (93.4 kg)   BMI 38.92 kg/m  Physical Exam Gen: NAD, alert, cooperative with exam, well-appearing MSK:  Neurovascularly intact       ASSESSMENT & PLAN:   Paresthesia of left foot Acute on chronic in nature after an injury at work.  X-ray, MRI and nerve study have been normal to this point.  She continues to have ongoing sensational changes in the left foot. -Functional capacity exam shows that she has ability to perform at the sedentary with abilities of the light physical demand level occasionally

## 2021-12-17 NOTE — Patient Instructions (Signed)
Good to see you  Please send me a message in MyChart with any questions or updates.  Please see me back as needed.   --Dr. Jesalyn Finazzo  

## 2021-12-17 NOTE — Assessment & Plan Note (Signed)
Acute on chronic in nature after an injury at work.  X-ray, MRI and nerve study have been normal to this point.  She continues to have ongoing sensational changes in the left foot. -Functional capacity exam shows that she has ability to perform at the sedentary with abilities of the light physical demand level occasionally

## 2022-06-18 ENCOUNTER — Emergency Department (HOSPITAL_BASED_OUTPATIENT_CLINIC_OR_DEPARTMENT_OTHER)
Admission: EM | Admit: 2022-06-18 | Discharge: 2022-06-18 | Disposition: A | Discharge status: Home / Self Care | Payer: 59 | Attending: Emergency Medicine | Admitting: Emergency Medicine

## 2022-06-18 ENCOUNTER — Other Ambulatory Visit: Payer: Self-pay

## 2022-06-18 ENCOUNTER — Emergency Department (HOSPITAL_BASED_OUTPATIENT_CLINIC_OR_DEPARTMENT_OTHER): Payer: 59

## 2022-06-18 DIAGNOSIS — R519 Headache, unspecified: Secondary | ICD-10-CM | POA: Insufficient documentation

## 2022-06-18 DIAGNOSIS — Y92410 Unspecified street and highway as the place of occurrence of the external cause: Secondary | ICD-10-CM | POA: Diagnosis not present

## 2022-06-18 DIAGNOSIS — D649 Anemia, unspecified: Secondary | ICD-10-CM | POA: Diagnosis not present

## 2022-06-18 DIAGNOSIS — S161XXA Strain of muscle, fascia and tendon at neck level, initial encounter: Secondary | ICD-10-CM | POA: Insufficient documentation

## 2022-06-18 DIAGNOSIS — R1084 Generalized abdominal pain: Secondary | ICD-10-CM | POA: Insufficient documentation

## 2022-06-18 DIAGNOSIS — E876 Hypokalemia: Secondary | ICD-10-CM | POA: Insufficient documentation

## 2022-06-18 DIAGNOSIS — M542 Cervicalgia: Secondary | ICD-10-CM | POA: Diagnosis present

## 2022-06-18 LAB — COMPREHENSIVE METABOLIC PANEL
ALT: 32 U/L (ref 0–44)
AST: 29 U/L (ref 15–41)
Albumin: 4.1 g/dL (ref 3.5–5.0)
Alkaline Phosphatase: 67 U/L (ref 38–126)
Anion gap: 10 (ref 5–15)
BUN: 6 mg/dL (ref 6–20)
CO2: 24 mmol/L (ref 22–32)
Calcium: 8.8 mg/dL — ABNORMAL LOW (ref 8.9–10.3)
Chloride: 103 mmol/L (ref 98–111)
Creatinine, Ser: 0.61 mg/dL (ref 0.44–1.00)
GFR, Estimated: >60 mL/min (ref >60)
Glucose, Bld: 97 mg/dL (ref 70–99)
Potassium: 3 mmol/L — ABNORMAL LOW (ref 3.5–5.1)
Sodium: 137 mmol/L (ref 135–145)
Total Bilirubin: 0.5 mg/dL (ref 0.3–1.2)
Total Protein: 8.2 g/dL — ABNORMAL HIGH (ref 6.5–8.1)

## 2022-06-18 LAB — CBC WITH DIFFERENTIAL/PLATELET
Abs Immature Granulocytes: 0.07 10*3/uL (ref 0.00–0.07)
Basophils Absolute: 0.1 10*3/uL (ref 0.0–0.1)
Basophils Relative: 1 %
Eosinophils Absolute: 0.2 10*3/uL (ref 0.0–0.5)
Eosinophils Relative: 2 %
HCT: 35 % — ABNORMAL LOW (ref 36.0–46.0)
Hemoglobin: 11.3 g/dL — ABNORMAL LOW (ref 12.0–15.0)
Immature Granulocytes: 1 %
Lymphocytes Relative: 27 %
Lymphs Abs: 2.8 10*3/uL (ref 0.7–4.0)
MCH: 25.7 pg — ABNORMAL LOW (ref 26.0–34.0)
MCHC: 32.3 g/dL (ref 30.0–36.0)
MCV: 79.5 fL — ABNORMAL LOW (ref 80.0–100.0)
Monocytes Absolute: 0.7 10*3/uL (ref 0.1–1.0)
Monocytes Relative: 7 %
Neutro Abs: 6.3 10*3/uL (ref 1.7–7.7)
Neutrophils Relative %: 62 %
Platelets: 328 10*3/uL (ref 150–400)
RBC: 4.4 MIL/uL (ref 3.87–5.11)
RDW: 14.7 % (ref 11.5–15.5)
WBC: 10.2 10*3/uL (ref 4.0–10.5)
nRBC: 0 % (ref 0.0–0.2)

## 2022-06-18 LAB — HCG, SERUM, QUALITATIVE: Preg, Serum: NEGATIVE

## 2022-06-18 LAB — LIPASE, BLOOD: Lipase: 35 U/L (ref 11–51)

## 2022-06-18 MED ORDER — IOHEXOL 300 MG/ML  SOLN
100.0000 mL | Freq: Once | INTRAMUSCULAR | Status: AC | PRN
  Administered 2022-06-18: 100 mL via INTRAVENOUS

## 2022-06-18 MED ORDER — METHOCARBAMOL 500 MG PO TABS: 500.0000 mg | ORAL_TABLET | Freq: Three times a day (TID) | ORAL | 0 refills | Status: DC | PRN

## 2022-06-18 MED ORDER — LORAZEPAM 1 MG PO TABS
1.0000 mg | ORAL_TABLET | Freq: Once | ORAL | Status: AC
  Administered 2022-06-18: 1 mg via ORAL
  Filled 2022-06-18: qty 1

## 2022-06-18 MED ORDER — DICLOFENAC SODIUM 1 % EX GEL: 2.0000 g | Freq: Four times a day (QID) | CUTANEOUS | 0 refills | Status: DC | PRN
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
## 2022-06-18 NOTE — Discharge Instructions (Signed)
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
## 2022-09-02 ENCOUNTER — Encounter: Payer: Self-pay | Admitting: *Deleted

## 2023-02-26 ENCOUNTER — Encounter (HOSPITAL_BASED_OUTPATIENT_CLINIC_OR_DEPARTMENT_OTHER): Payer: Self-pay | Admitting: Emergency Medicine

## 2023-02-26 ENCOUNTER — Emergency Department (HOSPITAL_BASED_OUTPATIENT_CLINIC_OR_DEPARTMENT_OTHER)
Admission: EM | Admit: 2023-02-26 | Discharge: 2023-02-26 | Disposition: A | Discharge status: Home / Self Care | Payer: Medicaid Other | Attending: Emergency Medicine | Admitting: Emergency Medicine

## 2023-02-26 ENCOUNTER — Emergency Department (HOSPITAL_BASED_OUTPATIENT_CLINIC_OR_DEPARTMENT_OTHER): Payer: Medicaid Other

## 2023-02-26 ENCOUNTER — Other Ambulatory Visit: Payer: Self-pay

## 2023-02-26 DIAGNOSIS — N134 Hydroureter: Secondary | ICD-10-CM | POA: Diagnosis not present

## 2023-02-26 DIAGNOSIS — N3090 Cystitis, unspecified without hematuria: Secondary | ICD-10-CM | POA: Diagnosis not present

## 2023-02-26 DIAGNOSIS — N201 Calculus of ureter: Secondary | ICD-10-CM

## 2023-02-26 DIAGNOSIS — D72829 Elevated white blood cell count, unspecified: Secondary | ICD-10-CM | POA: Insufficient documentation

## 2023-02-26 DIAGNOSIS — R109 Unspecified abdominal pain: Secondary | ICD-10-CM | POA: Diagnosis present

## 2023-02-26 LAB — CBC WITH DIFFERENTIAL/PLATELET
Abs Immature Granulocytes: 0.07 10*3/uL (ref 0.00–0.07)
Basophils Absolute: 0.1 10*3/uL (ref 0.0–0.1)
Basophils Relative: 1 %
Eosinophils Absolute: 0.1 10*3/uL (ref 0.0–0.5)
Eosinophils Relative: 1 %
HCT: 37.4 % (ref 36.0–46.0)
Hemoglobin: 12.2 g/dL (ref 12.0–15.0)
Immature Granulocytes: 1 %
Lymphocytes Relative: 21 %
Lymphs Abs: 2.3 10*3/uL (ref 0.7–4.0)
MCH: 26.8 pg (ref 26.0–34.0)
MCHC: 32.6 g/dL (ref 30.0–36.0)
MCV: 82.2 fL (ref 80.0–100.0)
Monocytes Absolute: 0.6 10*3/uL (ref 0.1–1.0)
Monocytes Relative: 5 %
Neutro Abs: 7.9 10*3/uL — ABNORMAL HIGH (ref 1.7–7.7)
Neutrophils Relative %: 71 %
Platelets: 303 10*3/uL (ref 150–400)
RBC: 4.55 MIL/uL (ref 3.87–5.11)
RDW: 14.2 % (ref 11.5–15.5)
WBC: 11 10*3/uL — ABNORMAL HIGH (ref 4.0–10.5)
nRBC: 0 % (ref 0.0–0.2)

## 2023-02-26 LAB — URINALYSIS, MICROSCOPIC (REFLEX)

## 2023-02-26 LAB — COMPREHENSIVE METABOLIC PANEL
ALT: 20 U/L (ref 0–44)
AST: 18 U/L (ref 15–41)
Albumin: 4.5 g/dL (ref 3.5–5.0)
Alkaline Phosphatase: 68 U/L (ref 38–126)
Anion gap: 12 (ref 5–15)
BUN: 10 mg/dL (ref 6–20)
CO2: 22 mmol/L (ref 22–32)
Calcium: 9.7 mg/dL (ref 8.9–10.3)
Chloride: 103 mmol/L (ref 98–111)
Creatinine, Ser: 0.71 mg/dL (ref 0.44–1.00)
GFR, Estimated: >60 mL/min (ref >60)
Glucose, Bld: 141 mg/dL — ABNORMAL HIGH (ref 70–99)
Potassium: 3.5 mmol/L (ref 3.5–5.1)
Sodium: 137 mmol/L (ref 135–145)
Total Bilirubin: 0.3 mg/dL (ref 0.3–1.2)
Total Protein: 8 g/dL (ref 6.5–8.1)

## 2023-02-26 LAB — I-STAT CHEM 8, ED
BUN: 7 mg/dL (ref 6–20)
Calcium, Ion: 1.23 mmol/L (ref 1.15–1.40)
Chloride: 105 mmol/L (ref 98–111)
Creatinine, Ser: 0.7 mg/dL (ref 0.44–1.00)
Glucose, Bld: 115 mg/dL — ABNORMAL HIGH (ref 70–99)
HCT: 37 % (ref 36.0–46.0)
Hemoglobin: 12.6 g/dL (ref 12.0–15.0)
Potassium: 3.8 mmol/L (ref 3.5–5.1)
Sodium: 138 mmol/L (ref 135–145)
TCO2: 23 mmol/L (ref 22–32)

## 2023-02-26 LAB — URINALYSIS, ROUTINE W REFLEX MICROSCOPIC
Bilirubin Urine: NEGATIVE
Glucose, UA: NEGATIVE mg/dL
Ketones, ur: NEGATIVE mg/dL
Nitrite: POSITIVE — AB
Protein, ur: 100 mg/dL — AB
Specific Gravity, Urine: >=1.03 (ref 1.005–1.030)
pH: 5.5 (ref 5.0–8.0)

## 2023-02-26 LAB — PREGNANCY, URINE: Preg Test, Ur: NEGATIVE

## 2023-02-26 LAB — LIPASE, BLOOD: Lipase: 27 U/L (ref 11–51)

## 2023-02-26 MED ORDER — ONDANSETRON HCL 4 MG/2ML IJ SOLN
4.0000 mg | Freq: Once | INTRAMUSCULAR | Status: AC
  Administered 2023-02-26: 4 mg via INTRAVENOUS
  Filled 2023-02-26: qty 2

## 2023-02-26 MED ORDER — IOHEXOL 300 MG/ML  SOLN
100.0000 mL | Freq: Once | INTRAMUSCULAR | Status: AC | PRN
  Administered 2023-02-26: 100 mL via INTRAVENOUS

## 2023-02-26 MED ORDER — ONDANSETRON 4 MG PO TBDP: 4.0000 mg | ORAL_TABLET | Freq: Three times a day (TID) | ORAL | 0 refills | Status: DC | PRN

## 2023-02-26 MED ORDER — MORPHINE SULFATE (PF) 4 MG/ML IV SOLN
4.0000 mg | Freq: Once | INTRAVENOUS | Status: AC
  Administered 2023-02-26: 4 mg via INTRAVENOUS
  Filled 2023-02-26: qty 1

## 2023-02-26 MED ORDER — CEFDINIR 300 MG PO CAPS: 300.0000 mg | ORAL_CAPSULE | Freq: Two times a day (BID) | ORAL | 0 refills | Status: AC

## 2023-02-26 MED ORDER — OXYCODONE HCL 5 MG PO TABS: 5.0000 mg | ORAL_TABLET | Freq: Four times a day (QID) | ORAL | 0 refills | Status: DC | PRN

## 2023-02-26 MED ORDER — CEFDINIR 300 MG PO CAPS
300.0000 mg | ORAL_CAPSULE | Freq: Once | ORAL | Status: AC
  Administered 2023-02-26: 300 mg via ORAL
  Filled 2023-02-26: qty 1

## 2023-02-26 NOTE — ED Provider Notes (Signed)
[facility] Provider Note   CSN: [id number] Arrival date & time: 02/26/23  1346     History  Chief Complaint  Patient presents with   Flank Pain    Wendy [name] is a 32 y.o. female.  With history of anxiety, panic attacks presenting to the ED for evaluation of left flank pain.  Pain began approximately 1 hour prior to arrival.  Is localized to the left lower back with radiation to the left lower quadrant of the abdomen.  Described as a sharp and stabbing sensation.  States it is constant and worse with any type of movement or laying on that side.  She denies any history of similar symptoms in the past.  Denies fevers, chills, urinary complaints, vaginal complaints.  She does report history of hysterectomy and right-sided oophorectomy.  Still has her left ovary.  She reports some associated nausea.  No vomiting.  She denies any recent diarrhea, melena, hematochezia.  Currently rates her pain as severe.  She denies chest pain, cough, shortness of breath.    Flank Pain       Home Medications Prior to Admission medications   Medication Sig Start Date End Date Taking? Authorizing Provider  cefdinir (OMNICEF) 300 MG capsule Take 1 capsule (300 mg total) by mouth 2 (two) times daily for 7 days. 02/26/23 03/05/23 Yes Kyley Solow, Edsel Petrin, PA-C  ondansetron (ZOFRAN-ODT) 4 MG disintegrating tablet Take 1 tablet (4 mg total) by mouth every 8 (eight) hours as needed for nausea or vomiting. 02/26/23  Yes Ahria Slappey, Edsel Petrin, PA-C  oxyCODONE (ROXICODONE) 5 MG immediate release tablet Take 1 tablet (5 mg total) by mouth every 6 (six) hours as needed for severe pain. 02/26/23  Yes Jo Booze, Edsel Petrin, PA-C  diclofenac Sodium (VOLTAREN) 1 % GEL Apply 2 g topically 4 (four) times daily as needed. 06/18/22   Long, Arlyss Repress, MD  methocarbamol (ROBAXIN) 500 MG tablet Take 1 tablet (500 mg total) by mouth every 8 (eight) hours as needed for muscle spasms. 06/18/22    Long, Arlyss Repress, MD      Allergies    Sulfa antibiotics, Latex, Penicillins, and Topiramate    Review of Systems   Review of Systems  Genitourinary:  Positive for flank pain.  All other systems reviewed and are negative.   Physical Exam Updated Vital Signs BP 124/84   Pulse 73   Temp 98.3 F (36.8 C) (Oral)   Resp 18   Wt 103 kg   SpO2 100%   BMI 42.89 kg/m  Physical Exam Vitals and nursing note reviewed.  Constitutional:      General: She is not in acute distress.    Appearance: Normal appearance. She is normal weight. She is not ill-appearing.  HENT:     Head: Normocephalic and atraumatic.  Pulmonary:     Effort: Pulmonary effort is normal. No respiratory distress.  Abdominal:     General: Abdomen is flat.     Tenderness: There is abdominal tenderness (Left lower quadrant). There is no right CVA tenderness or left CVA tenderness.  Musculoskeletal:        General: Normal range of motion.     Cervical back: Neck supple.  Skin:    General: Skin is warm and dry.  Neurological:     Mental Status: She is alert and oriented to person, place, and time.  Psychiatric:        Mood and Affect: Mood normal.  Behavior: Behavior normal.     ED Results / Procedures / Treatments   Labs (all labs ordered are listed, but only abnormal results are displayed) Labs Reviewed  URINALYSIS, ROUTINE W REFLEX MICROSCOPIC - Abnormal; Notable for the following components:      Result Value   APPearance CLOUDY (*)    Hgb urine dipstick LARGE (*)    Protein, ur 100 (*)    Nitrite POSITIVE (*)    Leukocytes,Ua SMALL (*)    All other components within normal limits  CBC WITH DIFFERENTIAL/PLATELET - Abnormal; Notable for the following components:   WBC 11.0 (*)    Neutro Abs 7.9 (*)    All other components within normal limits  COMPREHENSIVE METABOLIC PANEL - Abnormal; Notable for the following components:   Glucose, Bld 141 (*)    All other components within normal limits   URINALYSIS, MICROSCOPIC (REFLEX) - Abnormal; Notable for the following components:   Bacteria, UA MANY (*)    All other components within normal limits  I-STAT CHEM 8, ED - Abnormal; Notable for the following components:   Glucose, Bld 115 (*)    All other components within normal limits  URINE CULTURE  PREGNANCY, URINE  LIPASE, BLOOD    EKG None  Radiology CT ABDOMEN PELVIS W CONTRAST  Result Date: 02/26/2023 CLINICAL DATA:  Left lower quadrant pain. EXAM: CT ABDOMEN AND PELVIS WITH CONTRAST TECHNIQUE: Multidetector CT imaging of the abdomen and pelvis was performed using the standard protocol following bolus administration of intravenous contrast. RADIATION DOSE REDUCTION: This exam was performed according to the departmental dose-optimization program which includes automated exposure control, adjustment of the mA and/or kV according to patient size and/or use of iterative reconstruction technique. CONTRAST:  OMNIPAQUE IOHEXOL 300 MG/ML  SOLN COMPARISON:  06/18/2022 FINDINGS: Lower chest: Mild hypoventilatory changes without focal airspace disease. Hepatobiliary: Diffuse hepatic steatosis. The liver is enlarged spanning 20.5 cm cranial caudal. No focal liver abnormality. Gallbladder physiologically distended, no calcified stone. No biliary dilatation. Pancreas: No ductal dilatation or inflammation. Spleen: Normal in size without focal abnormality. Adrenals/Urinary Tract: Normal adrenal glands. There is an obstructing 5 x 8 mm stone in the left proximal ureter (at the level of L3) with proximal hydroureteronephrosis and mild perinephric edema. More distal ureter is decompressed. No right hydronephrosis. No additional intrarenal calculi. The urinary bladder is near completely empty. Stomach/Bowel: Stomach is within normal limits. Appendix appears normal. No evidence of bowel wall thickening, distention, or inflammatory changes. Vascular/Lymphatic: Normal caliber abdominal aorta. Patent  portal vein. Duplicated IVC. No adenopathy. Reproductive: The uterus is not well seen, query hysterectomy. Right ovary is normal in appearance. The left ovary is not definitively seen. Other: No free air, free fluid, or intra-abdominal fluid collection. Diminutive fat containing umbilical hernia. Musculoskeletal: There are no acute or suspicious osseous abnormalities. IMPRESSION: 1. Obstructing 5 x 8 mm stone in the left proximal ureter with proximal hydroureteronephrosis and mild perinephric edema. 2. Hepatic steatosis and hepatomegaly. Electronically Signed   By: Narda Rutherford M.D.   On: 02/26/2023 20:01    Procedures Procedures    Medications Ordered in ED Medications  morphine (PF) 4 MG/ML injection 4 mg (4 mg Intravenous Given 02/26/23 1428)  ondansetron (ZOFRAN) injection 4 mg (4 mg Intravenous Given 02/26/23 1424)  morphine (PF) 4 MG/ML injection 4 mg (4 mg Intravenous Given 02/26/23 1650)  iohexol (OMNIPAQUE) 300 MG/ML solution 100 mL (100 mLs Intravenous Contrast Given 02/26/23 1750)  ondansetron (ZOFRAN) injection 4 mg (4 mg  Intravenous Given 02/26/23 1914)  cefdinir (OMNICEF) capsule 300 mg (300 mg Oral Given 02/26/23 2059)    ED Course/ Medical Decision Making/ A&P Clinical Course as of 02/26/23 2120  Wed Feb 26, 2023  1819 Spoke with urology Dr. Laverle Patter.  He recommends third-generation cephalosporin, Flomax, call for an appointment tomorrow.  If she develops a fever, present to the [facility] emergency department. [AS]    Clinical Course User Index [AS] Lula Olszewski Edsel Petrin, PA-C                                 Medical Decision Making Amount and/or Complexity of Data Reviewed Labs: ordered. Radiology: ordered.  Risk Prescription drug management.   This patient presents to the ED for concern of left flank pain, this involves an extensive number of treatment options, and is a complaint that carries with it a high risk of complications and morbidity. The differential  diagnosis of emergent flank pain includes, but is not limited to :Abdominal aortic aneurysm,, Renal artery embolism,Renal vein thrombosis, Aortic dissection, Mesenteric ischemia, Pyelonephritis, Renal infarction, Renal hemorrhage, Nephrolithiasis/ Renal Colic, Bladder tumor,Cystitis, Biliary colic, Pancreatitis Perforated peptic ulcer Appendicitis ,Inguinal Hernia, Diverticulitis, Bowel obstruction Ectopic Pregnancy,PID/TOA,Ovarian cyst, Ovarian torsion Shingles Lower lobe pneumonia, Retroperitoneal hematoma/abscess/tumor, Epidural abscess, Epidural hematoma   My initial workup includes labs, imaging  Additional history obtained from: Nursing notes from this visit. Family mother at bed side provides a portion of the history  I ordered, reviewed and interpreted labs which include: CBC CMP, lipase, urinalysis, urine pregnancy  I ordered imaging studies including CT abdomen pelvis I independently visualized and interpreted imaging which showed 5 to 8 mm obstructing stone in the left ureter I agree with the radiologist interpretation  Consultations Obtained:  I requested consultation with urology Dr. Laverle Patter,  and discussed lab and imaging findings as well as pertinent plan - they recommend: Antibiotics, Flomax, analgesia, follow-up tomorrow  Afebrile, hemodynamically stable.  32 year old female presenting to the ED for evaluation of left flank pain.  This is sudden in onset.  She denies any urinary complaints.  On exam, she appears uncomfortable.  There is mild tenderness to palpation of the left abdomen.  Lab workup with mild leukocytosis of 11.0.  Urine with nitrite positive, small leukocytes, large hemoglobin.  This is consistent with an infected stone.  No abnormalities of kidney function today.  CT reveals a 5 x 8 mm stone in the left ureter with obstruction.  Consulted with urology with recommendations as stated above.  Unfortunately, patient has a sulfa allergy and cannot take Flomax.  Will be  started on cefdinir for the infected stone.  She was given contact information for urology and encouraged to call for the soonest appointment available.  She was encouraged to return to the emergency department with any new or worsening symptoms or any fevers.  She was sent prescriptions for Zofran, cefdinir and oxycodone.  She was educated on potential side effects of opioid therapy.  She was able to tolerate p.o. intake without difficulty.  Stable at discharge.  At this time there does not appear to be any evidence of an acute emergency medical condition and the patient appears stable for discharge with appropriate outpatient follow up. Diagnosis was discussed with patient who verbalizes understanding of care plan and is agreeable to discharge. I have discussed return precautions with patient and mother who verbalizes understanding. Patient encouraged to follow-up with urology as soon as  possible. All questions answered.  Patient's case discussed with Dr. Adela Lank who agrees with plan to discharge with follow-up.   Note: Portions of this report may have been transcribed using voice recognition software. Every effort was made to ensure accuracy; however, inadvertent computerized transcription errors may still be present.        Final Clinical Impression(s) / ED Diagnoses Final diagnoses:  Ureterolithiasis  Cystitis    Rx / DC Orders ED Discharge Orders          Ordered    cefdinir (OMNICEF) 300 MG capsule  2 times daily        02/26/23 2109    ondansetron (ZOFRAN-ODT) 4 MG disintegrating tablet  Every 8 hours PRN        02/26/23 2109    oxyCODONE (ROXICODONE) 5 MG immediate release tablet  Every 6 hours PRN        02/26/23 2109              Mora Bellman 02/26/23 2120    Melene Plan, DO 02/26/23 2132

## 2023-02-26 NOTE — ED Notes (Signed)
Patient transported to CT 

## 2023-02-26 NOTE — Discharge Instructions (Addendum)
You have been seen today for your complaint of flank pain. Your lab work showed a urinary tract infection. Your imaging showed a kidney stone on your left side. Your discharge medications include Zofran.  This is a nausea medicine.  Take it as needed to eat and drink normal diet. Flomax.  This is a medicine used to help pass the stone.  Take it daily until you pass the stone. Cefdinir.  This is an antibiotic. You should take it as prescribed. You should take it for the entire duration of the prescription. This may cause an upset stomach. This is normal. You may take this with food. You may also eat yogurt to prevent diarrhea.  Oxycodone. This is an opioid pain medication. You should only take this medication as needed for severe pain. You should not drive, operate heavy machinery or make important decisions while taking this medication. You should use alternative methods for pain relief while taking this medication including stretching, gentle range of motion, and alternating tylenol and ibuprofen. Follow up with: Dr. Laverle Patter.  He is a urologist.  Call tomorrow to schedule an appointment for soon as possible. Please seek immediate medical care if you develop any of the following symptoms: You have a fever or chills. You develop severe pain. You develop new abdominal pain. You faint. You are unable to urinate. If you develop any of the symptoms, report to the [facility] emergency department immediately At this time there does not appear to be the presence of an emergent medical condition, however there is always the potential for conditions to change. Please read and follow the below instructions.  Do not take your medicine if  develop an itchy rash, swelling in your mouth or lips, or difficulty breathing; call 911 and seek immediate emergency medical attention if this occurs.  You may review your lab tests and imaging results in their entirety on your MyChart account.  Please discuss all results  of fully with your primary care provider and other specialist at your follow-up visit.  Note: Portions of this text may have been transcribed using voice recognition software. Every effort was made to ensure accuracy; however, inadvertent computerized transcription errors may still be present.

## 2023-02-26 NOTE — ED Notes (Signed)
ED Provider at bedside. 

## 2023-02-26 NOTE — ED Triage Notes (Signed)
Left flank pain x 1 hours, nausea , no urinary symptoms , no Hx kidney stone . Obvious distress.

## 2023-02-27 ENCOUNTER — Telehealth (HOSPITAL_BASED_OUTPATIENT_CLINIC_OR_DEPARTMENT_OTHER): Payer: Self-pay | Admitting: Emergency Medicine

## 2023-02-27 LAB — URINE CULTURE: Culture: 50000 — AB

## 2023-02-27 MED ORDER — TAMSULOSIN HCL 0.4 MG PO CAPS: 0.4000 mg | ORAL_CAPSULE | Freq: Every day | ORAL | 0 refills | Status: AC

## 2023-02-27 NOTE — Telephone Encounter (Signed)
Patient was looking for her Flomax prescription and realized it was never sent to the pharmacy requesting it be sent.

## 2023-02-28 ENCOUNTER — Telehealth (HOSPITAL_BASED_OUTPATIENT_CLINIC_OR_DEPARTMENT_OTHER): Payer: Self-pay | Admitting: *Deleted

## 2023-02-28 NOTE — Telephone Encounter (Signed)
Post ED Visit - Positive Culture Follow-up  Culture report reviewed by antimicrobial stewardship pharmacist: [facility] [x]  [place], [place].D. []  Celedonio Miyamoto, [address].D., BCPS AQ-ID []  Garvin Fila, Pharm.D., BCPS []  Georgina Pillion, [address].D., BCPS []  [place], [address].D., BCPS, AAHIVP []  Estella Husk, Pharm.D., BCPS, AAHIVP []  Lysle Pearl, PharmD, BCPS []  Phillips Climes, PharmD, BCPS []  Agapito Games, PharmD, BCPS []  Verlan Friends, PharmD []  Mervyn Gay, PharmD, BCPS []  Vinnie Level, PharmD  [facility] []  Len Childs, PharmD []  Greer Pickerel, PharmD []  Adalberto Cole, PharmD []  Perlie Gold, Rph []  Lonell Face) Jean Rosenthal, PharmD []  Earl Many, PharmD []  Junita Push, PharmD []  Dorna Leitz, PharmD []  Terrilee Files, PharmD []  Lynann Beaver, PharmD []  Keturah Barre, PharmD []  Loralee Pacas, PharmD []  Bernadene Person, PharmD   Positive urine culture Treated with Cefdinir, organism sensitive to the same and no further patient follow-up is required at this time.  Virl Axe [facility] 02/28/2023, 9:01 AM

## 2023-03-04 ENCOUNTER — Encounter: Payer: Self-pay | Admitting: Urology

## 2023-03-05 ENCOUNTER — Ambulatory Visit (HOSPITAL_BASED_OUTPATIENT_CLINIC_OR_DEPARTMENT_OTHER)
Admission: RE | Admit: 2023-03-05 | Discharge: 2023-03-05 | Disposition: A | Discharge status: Home / Self Care | Payer: Medicaid Other | Source: Ambulatory Visit | Attending: Urology | Admitting: Urology

## 2023-03-05 ENCOUNTER — Ambulatory Visit (INDEPENDENT_AMBULATORY_CARE_PROVIDER_SITE_OTHER): Payer: Medicaid Other | Admitting: Urology

## 2023-03-05 ENCOUNTER — Encounter: Payer: Self-pay | Admitting: Urology

## 2023-03-05 ENCOUNTER — Ambulatory Visit: Payer: Self-pay | Admitting: Urology

## 2023-03-05 VITALS — BP 116/80 | HR 89 | Ht 62.0 in | Wt 229.0 lb

## 2023-03-05 DIAGNOSIS — N201 Calculus of ureter: Secondary | ICD-10-CM

## 2023-03-05 LAB — URINALYSIS, ROUTINE W REFLEX MICROSCOPIC
Bilirubin, UA: NEGATIVE
Glucose, UA: NEGATIVE
Ketones, UA: NEGATIVE
Nitrite, UA: NEGATIVE
Specific Gravity, UA: >1.03 — ABNORMAL HIGH (ref 1.005–1.030)
Urobilinogen, Ur: 0.2 mg/dL (ref 0.2–1.0)
pH, UA: 6 (ref 5.0–7.5)

## 2023-03-05 LAB — MICROSCOPIC EXAMINATION

## 2023-03-05 MED ORDER — OXYCODONE HCL 5 MG PO TABS: 5.0000 mg | ORAL_TABLET | Freq: Four times a day (QID) | ORAL | 0 refills | Status: AC | PRN

## 2023-03-05 NOTE — H&P (View-Only) (Signed)
Assessment: 1. Ureteral calculus, left, proximal     Plan: I personally reviewed the patient's chart including provider notes, lab and imaging results. I personally reviewed the CT study from 02/26/2023 with results as noted below. I personally reviewed the KUB from today which shows a 4 x 7 mm calculus just below the tip of the left L3 transverse process. Options for management of the left ureteral calculus discussed including spontaneous passage, shockwave lithotripsy, and ureteroscopic laser lithotripsy.  Following our discussion, she would like to proceed with left shockwave lithotripsy. Refill of pain medication provided. Continue tamsulosin. Continue to strain urine.  Procedure: The patient will be scheduled for left ESL at [facility].  Surgical request is placed with the surgery schedulers and will be scheduled at the patient's/family request. Informed consent is given as documented below. Anesthesia:  local with IV sedation  The patient does not have sleep apnea, history of MRSA, history of VRE, history of cardiac device requiring special anesthetic needs. Patient is stable and considered clear for surgical in an outpatient ambulatory surgery setting as well as patient hospital setting.  Consent for Operation or Procedure: Provider Certification I hereby certify that the nature, purpose, benefits, usual and most frequent risks of, and alternatives to, the operation or procedure have been explained to the patient (or person authorized to sign for the patient) either by me as responsible physician or by the provider who is to perform the operation or procedure. Time spent such that the patient/family has had an opportunity to ask questions, and that those questions have been answered. The patient or the patient's representative has been advised that selected tasks may be performed by assistants to the primary health care provider(s). I believe that the patient (or person  authorized to sign for the patient) understands what has been explained, and has consented to the operation or procedure. No guarantees were implied or made.   Chief Complaint:  Chief Complaint  Patient presents with   Nephrolithiasis    History of Present Illness:  Wendy [name] is a 32 y.o. female who is seen for evaluation of a left ureteral calculus. She had acute onset of left-sided flank and abdominal pain on 02/26/2023.  She did have some associated nausea with vomiting.  No fevers or chills.  No dysuria or gross hematuria. CT imaging from 02/26/2023 showed a 5 x 8 mm stone in the left proximal ureter with obstructive changes. Cr 0.71 WBC 11.0K Urine sure grew 50K group B strep agalactiae.  She has been on Omnicef x 7 days. She had also been taking tamsulosin.  She has been straining her urine but has not passed a stone.  She has been using ibuprofen as needed for pain.  Her last episode of pain was yesterday.  Past Medical History:  Past Medical History:  Diagnosis Date   Anxiety    Panic attacks     Past Surgical History:  Past Surgical History:  Procedure Laterality Date   TONSILLECTOMY     WISDOM TOOTH EXTRACTION      Allergies:  Allergies  Allergen Reactions   Gabapentin     Other Reaction(s): Other (See Comments)  Severe Headaches   Meloxicam     Other Reaction(s): Arthralgias, Arthralgias (intolerance), Fatique, Malaise (intolerance), Myalgias, Myalgias (intolerance)  Worsening Pain   Sulfa Antibiotics Hives   Metformin Diarrhea    Other Reaction(s): GI Intolerance   Prednisone Diarrhea and Nausea And Vomiting    Other Reaction(s): Diarrhea, GI  Intolerance   Latex Swelling and Nausea And Vomiting    Per pt.  Reacted to J&JCovid vaccine told allergic to Latex   Penicillins Other (See Comments)    Family hx   Topiramate Rash    Family History:  No family history on file.  Social History:  Social History   Tobacco Use   Smoking status: Never    Smokeless tobacco: Never  Substance Use Topics   Alcohol use: Yes    Comment: Occasional   Drug use: No    Review of symptoms:  Constitutional:  Negative for unexplained weight loss, night sweats, fever, chills ENT:  Negative for nose bleeds, sinus pain, painful swallowing CV:  Negative for chest pain, shortness of breath, exercise intolerance, palpitations, loss of consciousness Resp:  Negative for cough, wheezing, shortness of breath GI:  Negative for nausea, vomiting, diarrhea, bloody stools GU:  Positives noted in HPI; otherwise negative for gross hematuria, dysuria, urinary incontinence Neuro:  Negative for seizures, poor balance, limb weakness, slurred speech Psych:  Negative for lack of energy, depression, anxiety Endocrine:  Negative for polydipsia, polyuria, symptoms of hypoglycemia (dizziness, hunger, sweating) Hematologic:  Negative for anemia, purpura, petechia, prolonged or excessive bleeding, use of anticoagulants  Allergic:  Negative for difficulty breathing or choking as a result of exposure to anything; no shellfish allergy; no allergic response (rash/itch) to materials, foods  Physical exam: BP 116/80   Pulse 89   Ht 5\' 2"  (1.575 m)   Wt 229 lb (103.9 kg)   LMP 02/27/2021 (Exact Date)   BMI 41.88 kg/m  GENERAL APPEARANCE:  Well appearing, well developed, well nourished, NAD HEENT: Atraumatic, Normocephalic, oropharynx clear. NECK: Supple without lymphadenopathy or thyromegaly. LUNGS: Clear to auscultation bilaterally. HEART: Regular Rate and Rhythm without murmurs, gallops, or rubs. ABDOMEN: Soft, non-tender, No Masses. EXTREMITIES: Moves all extremities well.  Without clubbing, cyanosis, or edema. NEUROLOGIC:  Alert and oriented x 3, normal gait, CN II-XII grossly intact.  MENTAL STATUS:  Appropriate. BACK:  Non-tender to palpation.  No CVAT SKIN:  Warm, dry and intact.    Results: U/A:  11-30 WBC, 11-30 RBC, mod bacteria, nitrite negative

## 2023-03-05 NOTE — Progress Notes (Signed)
Assessment: 1. Ureteral calculus, left, proximal     Plan: I personally reviewed the patient's chart including provider notes, lab and imaging results. I personally reviewed the CT study from 02/26/2023 with results as noted below. I personally reviewed the KUB from today which shows a 4 x 7 mm calculus just below the tip of the left L3 transverse process. Options for management of the left ureteral calculus discussed including spontaneous passage, shockwave lithotripsy, and ureteroscopic laser lithotripsy.  Following our discussion, she would like to proceed with left shockwave lithotripsy. Refill of pain medication provided. Continue tamsulosin. Continue to strain urine.  Procedure: The patient will be scheduled for left ESL at [facility].  Surgical request is placed with the surgery schedulers and will be scheduled at the patient's/family request. Informed consent is given as documented below. Anesthesia:  local with IV sedation  The patient does not have sleep apnea, history of MRSA, history of VRE, history of cardiac device requiring special anesthetic needs. Patient is stable and considered clear for surgical in an outpatient ambulatory surgery setting as well as patient hospital setting.  Consent for Operation or Procedure: Provider Certification I hereby certify that the nature, purpose, benefits, usual and most frequent risks of, and alternatives to, the operation or procedure have been explained to the patient (or person authorized to sign for the patient) either by me as responsible physician or by the provider who is to perform the operation or procedure. Time spent such that the patient/family has had an opportunity to ask questions, and that those questions have been answered. The patient or the patient's representative has been advised that selected tasks may be performed by assistants to the primary health care provider(s). I believe that the patient (or person  authorized to sign for the patient) understands what has been explained, and has consented to the operation or procedure. No guarantees were implied or made.   Chief Complaint:  Chief Complaint  Patient presents with   Nephrolithiasis    History of Present Illness:  Wendy [name] is a 32 y.o. female who is seen for evaluation of a left ureteral calculus. She had acute onset of left-sided flank and abdominal pain on 02/26/2023.  She did have some associated nausea with vomiting.  No fevers or chills.  No dysuria or gross hematuria. CT imaging from 02/26/2023 showed a 5 x 8 mm stone in the left proximal ureter with obstructive changes. Cr 0.71 WBC 11.0K Urine sure grew 50K group B strep agalactiae.  She has been on Omnicef x 7 days. She had also been taking tamsulosin.  She has been straining her urine but has not passed a stone.  She has been using ibuprofen as needed for pain.  Her last episode of pain was yesterday.  Past Medical History:  Past Medical History:  Diagnosis Date   Anxiety    Panic attacks     Past Surgical History:  Past Surgical History:  Procedure Laterality Date   TONSILLECTOMY     WISDOM TOOTH EXTRACTION      Allergies:  Allergies  Allergen Reactions   Gabapentin     Other Reaction(s): Other (See Comments)  Severe Headaches   Meloxicam     Other Reaction(s): Arthralgias, Arthralgias (intolerance), Fatique, Malaise (intolerance), Myalgias, Myalgias (intolerance)  Worsening Pain   Sulfa Antibiotics Hives   Metformin Diarrhea    Other Reaction(s): GI Intolerance   Prednisone Diarrhea and Nausea And Vomiting    Other Reaction(s): Diarrhea, GI  Intolerance   Latex Swelling and Nausea And Vomiting    Per pt.  Reacted to J&JCovid vaccine told allergic to Latex   Penicillins Other (See Comments)    Family hx   Topiramate Rash    Family History:  No family history on file.  Social History:  Social History   Tobacco Use   Smoking status: Never    Smokeless tobacco: Never  Substance Use Topics   Alcohol use: Yes    Comment: Occasional   Drug use: No    Review of symptoms:  Constitutional:  Negative for unexplained weight loss, night sweats, fever, chills ENT:  Negative for nose bleeds, sinus pain, painful swallowing CV:  Negative for chest pain, shortness of breath, exercise intolerance, palpitations, loss of consciousness Resp:  Negative for cough, wheezing, shortness of breath GI:  Negative for nausea, vomiting, diarrhea, bloody stools GU:  Positives noted in HPI; otherwise negative for gross hematuria, dysuria, urinary incontinence Neuro:  Negative for seizures, poor balance, limb weakness, slurred speech Psych:  Negative for lack of energy, depression, anxiety Endocrine:  Negative for polydipsia, polyuria, symptoms of hypoglycemia (dizziness, hunger, sweating) Hematologic:  Negative for anemia, purpura, petechia, prolonged or excessive bleeding, use of anticoagulants  Allergic:  Negative for difficulty breathing or choking as a result of exposure to anything; no shellfish allergy; no allergic response (rash/itch) to materials, foods  Physical exam: BP 116/80   Pulse 89   Ht 5\' 2"  (1.575 m)   Wt 229 lb (103.9 kg)   LMP 02/27/2021 (Exact Date)   BMI 41.88 kg/m  GENERAL APPEARANCE:  Well appearing, well developed, well nourished, NAD HEENT: Atraumatic, Normocephalic, oropharynx clear. NECK: Supple without lymphadenopathy or thyromegaly. LUNGS: Clear to auscultation bilaterally. HEART: Regular Rate and Rhythm without murmurs, gallops, or rubs. ABDOMEN: Soft, non-tender, No Masses. EXTREMITIES: Moves all extremities well.  Without clubbing, cyanosis, or edema. NEUROLOGIC:  Alert and oriented x 3, normal gait, CN II-XII grossly intact.  MENTAL STATUS:  Appropriate. BACK:  Non-tender to palpation.  No CVAT SKIN:  Warm, dry and intact.    Results: U/A:  11-30 WBC, 11-30 RBC, mod bacteria, nitrite negative

## 2023-03-06 NOTE — Progress Notes (Signed)
Pre op phone call completed. Meds ok.  Motrin stopped. NPO after midnight.  Pt to arrive at noon.  Pt has a ride home.

## 2023-03-09 NOTE — Plan of Care (Signed)
CHL Tonsillectomy/Adenoidectomy, Postoperative PEDS care plan entered in error.

## 2023-03-10 ENCOUNTER — Ambulatory Visit (HOSPITAL_BASED_OUTPATIENT_CLINIC_OR_DEPARTMENT_OTHER)
Admission: RE | Admit: 2023-03-10 | Discharge: 2023-03-10 | Disposition: A | Discharge status: Home / Self Care | Payer: Medicaid Other | Attending: Urology | Admitting: Urology

## 2023-03-10 ENCOUNTER — Ambulatory Visit (HOSPITAL_COMMUNITY): Payer: Medicaid Other

## 2023-03-10 ENCOUNTER — Encounter (HOSPITAL_BASED_OUTPATIENT_CLINIC_OR_DEPARTMENT_OTHER)
Admission: RE | Disposition: A | Discharge status: Home / Self Care | Payer: Self-pay | Source: Home / Self Care | Attending: Urology

## 2023-03-10 ENCOUNTER — Encounter (HOSPITAL_BASED_OUTPATIENT_CLINIC_OR_DEPARTMENT_OTHER): Payer: Self-pay | Admitting: Urology

## 2023-03-10 DIAGNOSIS — I10 Essential (primary) hypertension: Secondary | ICD-10-CM | POA: Insufficient documentation

## 2023-03-10 DIAGNOSIS — E669 Obesity, unspecified: Secondary | ICD-10-CM | POA: Diagnosis not present

## 2023-03-10 DIAGNOSIS — N201 Calculus of ureter: Secondary | ICD-10-CM | POA: Diagnosis present

## 2023-03-10 DIAGNOSIS — Z6841 Body Mass Index (BMI) 40.0 and over, adult: Secondary | ICD-10-CM | POA: Insufficient documentation

## 2023-03-10 HISTORY — PX: EXTRACORPOREAL SHOCK WAVE LITHOTRIPSY: SHX1557

## 2023-03-10 SURGERY — LITHOTRIPSY, ESWL
Anesthesia: LOCAL | Laterality: Left

## 2023-03-10 MED ORDER — DIPHENHYDRAMINE HCL 25 MG PO CAPS
ORAL_CAPSULE | ORAL | Status: AC
  Filled 2023-03-10: qty 1

## 2023-03-10 MED ORDER — DIAZEPAM 5 MG PO TABS
ORAL_TABLET | ORAL | Status: AC
  Filled 2023-03-10: qty 2

## 2023-03-10 MED ORDER — DIPHENHYDRAMINE HCL 25 MG PO CAPS
25.0000 mg | ORAL_CAPSULE | ORAL | Status: AC
  Administered 2023-03-10: 25 mg via ORAL

## 2023-03-10 MED ORDER — DIAZEPAM 5 MG PO TABS
10.0000 mg | ORAL_TABLET | ORAL | Status: AC
  Administered 2023-03-10: 10 mg via ORAL

## 2023-03-10 MED ORDER — SODIUM CHLORIDE 0.9 % IV SOLN: INTRAVENOUS | Status: DC

## 2023-03-10 NOTE — Interval H&P Note (Signed)
History and Physical Interval Note:  03/10/2023 2:32 PM  Wendy [name]  has presented today for surgery, with the diagnosis of left ureteral stone.  The various methods of treatment have been discussed with the patient and family. After consideration of risks, benefits and other options for treatment, the patient has consented to  Procedure(s): EXTRACORPOREAL SHOCK WAVE LITHOTRIPSY (ESWL) (Left) as a surgical intervention.  The patient's history has been reviewed, patient examined, no change in status, stable for surgery.  I have reviewed the patient's chart and labs.  Questions were answered to the patient's satisfaction.     [name]

## 2023-03-10 NOTE — Progress Notes (Signed)
Petechiae noted at left lower abd from ESWL treatment.

## 2023-03-11 ENCOUNTER — Encounter (HOSPITAL_BASED_OUTPATIENT_CLINIC_OR_DEPARTMENT_OTHER): Payer: Self-pay | Admitting: Urology

## 2023-03-24 ENCOUNTER — Ambulatory Visit (INDEPENDENT_AMBULATORY_CARE_PROVIDER_SITE_OTHER): Payer: Medicaid Other | Admitting: Urology

## 2023-03-24 ENCOUNTER — Encounter: Payer: Self-pay | Admitting: Urology

## 2023-03-24 ENCOUNTER — Other Ambulatory Visit: Payer: Self-pay

## 2023-03-24 ENCOUNTER — Ambulatory Visit (HOSPITAL_BASED_OUTPATIENT_CLINIC_OR_DEPARTMENT_OTHER)
Admission: RE | Admit: 2023-03-24 | Discharge: 2023-03-24 | Disposition: A | Discharge status: Home / Self Care | Payer: No Typology Code available for payment source | Source: Ambulatory Visit | Attending: Urology | Admitting: Urology

## 2023-03-24 VITALS — BP 125/83 | HR 89 | Ht 62.0 in | Wt 226.0 lb

## 2023-03-24 DIAGNOSIS — Z87442 Personal history of urinary calculi: Secondary | ICD-10-CM

## 2023-03-24 DIAGNOSIS — N201 Calculus of ureter: Secondary | ICD-10-CM | POA: Diagnosis present

## 2023-03-24 DIAGNOSIS — Z09 Encounter for follow-up examination after completed treatment for conditions other than malignant neoplasm: Secondary | ICD-10-CM

## 2023-03-24 LAB — URINALYSIS, ROUTINE W REFLEX MICROSCOPIC
Bilirubin, UA: NEGATIVE
Glucose, UA: NEGATIVE
Ketones, UA: NEGATIVE
Leukocytes,UA: NEGATIVE
Nitrite, UA: NEGATIVE
Specific Gravity, UA: >1.03 — ABNORMAL HIGH (ref 1.005–1.030)
Urobilinogen, Ur: 0.2 mg/dL (ref 0.2–1.0)
pH, UA: 6 (ref 5.0–7.5)

## 2023-03-24 LAB — MICROSCOPIC EXAMINATION

## 2023-03-24 NOTE — Progress Notes (Signed)
Assessment: 1. Ureteral calculus, left     Plan: I reviewed the KUB study from today.  No obvious calcifications seen along the expected course of the left ureter. Stone material sent for [employer]. Return to office in 1 month with renal ultrasound prior to visit.  Chief Complaint:  Chief Complaint  Patient presents with   Nephrolithiasis    History of Present Illness:  Wendy [name] is a 32 y.o. female who is seen for further evaluation of a left ureteral calculus. She had acute onset of left-sided flank and abdominal pain on 02/26/2023.  She did have some associated nausea with vomiting.  No fevers or chills.  No dysuria or gross hematuria. CT imaging from 02/26/2023 showed a 5 x 8 mm stone in the left proximal ureter with obstructive changes. Cr 0.71 WBC 11.0K Urine culture grew 50K group B strep agalactiae.  She has been on Omnicef x 7 days. She underwent left ESL on 03/10/2023.  She has done well since the procedure.  She has passed some small fragments.  No flank pain.  No dysuria or gross hematuria.  Portions of the above documentation were copied from a prior visit for review purposes only.   Past Medical History:  Past Medical History:  Diagnosis Date   Anxiety    Panic attacks     Past Surgical History:  Past Surgical History:  Procedure Laterality Date   EXTRACORPOREAL SHOCK WAVE LITHOTRIPSY Left 03/10/2023   Procedure: EXTRACORPOREAL SHOCK WAVE LITHOTRIPSY (ESWL);  Surgeon: [name], MD;  Location: [facility];  Service: Urology;  Laterality: Left;   TONSILLECTOMY     WISDOM TOOTH EXTRACTION      Allergies:  Allergies  Allergen Reactions   Gabapentin     Other Reaction(s): Other (See Comments)  Severe Headaches   Latex Nausea And Vomiting, Swelling, Dermatitis, Hives and Rash    Per pt.  Reacted to J&JCovid vaccine told allergic to Latex  Other Reaction(s): GI Intolerance  Reacted to [employer] Covid vaccine   Meloxicam      Other Reaction(s): Arthralgias, Arthralgias (intolerance), Fatique, Malaise (intolerance), Myalgias, Myalgias (intolerance)  Worsening Pain   Penicillins Other (See Comments) and Anaphylaxis    Family hx  Reaction occurred in early childhood  Her mother told her of the reaction  Reaction occurred in early childhood, Her mother told her of the reaction   Sulfa Antibiotics Hives   Metformin Diarrhea    Other Reaction(s): GI Intolerance   Prednisone Diarrhea and Nausea And Vomiting    Other Reaction(s): Diarrhea, GI Intolerance   Silver Rash    Pain and bleeding   Topiramate Rash    Family History:  No family history on file.  Social History:  Social History   Tobacco Use   Smoking status: Never   Smokeless tobacco: Never  Substance Use Topics   Alcohol use: Yes    Comment: Occasional   Drug use: No    ROS: Constitutional:  Negative for fever, chills, weight loss CV: Negative for chest pain, previous MI, hypertension Respiratory:  Negative for shortness of breath, wheezing, sleep apnea, frequent cough GI:  Negative for nausea, vomiting, bloody stool, GERD  Physical exam: BP 125/83   Pulse 89   Ht 5\' 2"  (1.575 m)   Wt 226 lb (102.5 kg)   LMP 02/27/2021 (Exact Date)   BMI 41.34 kg/m  GENERAL APPEARANCE:  Well appearing, well developed, well nourished, NAD HEENT:  Atraumatic, normocephalic, oropharynx clear NECK:  Supple without lymphadenopathy or thyromegaly ABDOMEN:  Soft, non-tender, no masses EXTREMITIES:  Moves all extremities well, without clubbing, cyanosis, or edema NEUROLOGIC:  Alert and oriented x 3, normal gait, CN II-XII grossly intact MENTAL STATUS:  appropriate BACK:  Non-tender to palpation, No CVAT SKIN:  Warm, dry, and intact  Results: U/A: 0-5 WBCs, 3-10 RBCs, few bacteria.

## 2023-03-26 ENCOUNTER — Encounter: Payer: Self-pay | Admitting: Urology

## 2023-03-29 ENCOUNTER — Ambulatory Visit (HOSPITAL_BASED_OUTPATIENT_CLINIC_OR_DEPARTMENT_OTHER): Admission: RE | Admit: 2023-03-29 | Payer: Medicaid Other | Source: Ambulatory Visit

## 2023-04-15 ENCOUNTER — Telehealth (HOSPITAL_BASED_OUTPATIENT_CLINIC_OR_DEPARTMENT_OTHER): Payer: Self-pay

## 2023-04-19 ENCOUNTER — Encounter (HOSPITAL_BASED_OUTPATIENT_CLINIC_OR_DEPARTMENT_OTHER): Payer: Self-pay

## 2023-04-19 ENCOUNTER — Ambulatory Visit (HOSPITAL_BASED_OUTPATIENT_CLINIC_OR_DEPARTMENT_OTHER): Admission: RE | Admit: 2023-04-19 | Payer: Medicaid Other | Source: Ambulatory Visit

## 2023-04-19 ENCOUNTER — Telehealth: Payer: Medicaid Other | Admitting: Family Medicine

## 2023-04-19 ENCOUNTER — Encounter: Payer: Self-pay | Admitting: Urology

## 2023-04-19 DIAGNOSIS — B9689 Other specified bacterial agents as the cause of diseases classified elsewhere: Secondary | ICD-10-CM | POA: Diagnosis not present

## 2023-04-19 DIAGNOSIS — J019 Acute sinusitis, unspecified: Secondary | ICD-10-CM

## 2023-04-19 DIAGNOSIS — J4540 Moderate persistent asthma, uncomplicated: Secondary | ICD-10-CM | POA: Diagnosis not present

## 2023-04-19 MED ORDER — BENZONATATE 200 MG PO CAPS: 200.0000 mg | ORAL_CAPSULE | Freq: Two times a day (BID) | ORAL | 0 refills | Status: DC | PRN

## 2023-04-19 MED ORDER — DOXYCYCLINE HYCLATE 100 MG PO TABS: 100.0000 mg | ORAL_TABLET | Freq: Two times a day (BID) | ORAL | 0 refills | Status: AC

## 2023-04-19 NOTE — Progress Notes (Signed)
Virtual Visit Consent   Wendy [name], you are scheduled for a virtual visit with a [facility] provider today. Just as with appointments in the office, your consent must be obtained to participate. Your consent will be active for this visit and any virtual visit you may have with one of our providers in the next 365 days. If you have a MyChart account, a copy of this consent can be sent to you electronically.  As this is a virtual visit, video technology does not allow for your provider to perform a traditional examination. This may limit your provider's ability to fully assess your condition. If your provider identifies any concerns that need to be evaluated in person or the need to arrange testing (such as labs, EKG, etc.), we will make arrangements to do so. Although advances in technology are sophisticated, we cannot ensure that it will always work on either your end or our end. If the connection with a video visit is poor, the visit may have to be switched to a telephone visit. With either a video or telephone visit, we are not always able to ensure that we have a secure connection.  By engaging in this virtual visit, you consent to the provision of healthcare and authorize for your insurance to be billed (if applicable) for the services provided during this visit. Depending on your insurance coverage, you may receive a charge related to this service.  I need to obtain your verbal consent now. Are you willing to proceed with your visit today? Wendy [name] has provided verbal consent on 04/19/2023 for a virtual visit (video or telephone). Georgana Curio, FNP  Date: 04/19/2023 12:24 PM  Virtual Visit via Video Note   I, Georgana Curio, connected with  Wendy [name]  ([id number], 05-18-1991) on 04/19/23 at 12:30 PM EST by a video-enabled telemedicine application and verified that I am speaking with the correct person using two identifiers.  Location: Patient: Pt is riding in the back seat of  car. Provider: Virtual Visit Location Provider: Home Office   I discussed the limitations of evaluation and management by telemedicine and the availability of in person appointments. The patient expressed understanding and agreed to proceed.    History of Present Illness: Wendy [name] is a 32 y.o. who identifies as a female who was assigned female at birth, and is being seen today for sinus pain and pressure with green post nasal drainage, bad cough, congested, wheezing with albuterol on hand. Allergic to prednisone. Coughing spells are worsening. Sx for almost a week. Marland Kitchen  HPI: HPI  Problems:  Patient Active Problem List   Diagnosis Date Noted   Paresthesia of left foot 05/31/2021   Foot contusion 01/30/2021    Allergies:  Allergies  Allergen Reactions   Gabapentin     Other Reaction(s): Other (See Comments)  Severe Headaches   Latex Nausea And Vomiting, Swelling, Dermatitis, Hives and Rash    Per pt.  Reacted to J&JCovid vaccine told allergic to Latex  Other Reaction(s): GI Intolerance  Reacted to [employer]anssen Covid vaccine   Meloxicam     Other Reaction(s): Arthralgias, Arthralgias (intolerance), Fatique, Malaise (intolerance), Myalgias, Myalgias (intolerance)  Worsening Pain   Penicillins Other (See Comments) and Anaphylaxis    Family hx  Reaction occurred in early childhood  Her mother told her of the reaction  Reaction occurred in early childhood, Her mother told her of the reaction   Sulfa Antibiotics Hives   Metformin Diarrhea    Other  Reaction(s): GI Intolerance   Prednisone Diarrhea and Nausea And Vomiting    Other Reaction(s): Diarrhea, GI Intolerance   Silver Rash    Pain and bleeding   Topiramate Rash   Medications:  Current Outpatient Medications:    benzonatate (TESSALON) 200 MG capsule, Take 1 capsule (200 mg total) by mouth 2 (two) times daily as needed for cough., Disp: 20 capsule, Rfl: 0   doxycycline (VIBRA-TABS) 100 MG tablet, Take 1 tablet (100 mg  total) by mouth 2 (two) times daily for 10 days., Disp: 20 tablet, Rfl: 0   amLODipine (NORVASC) 2.5 MG tablet, Take 2.5 mg by mouth daily., Disp: , Rfl:    ibuprofen (ADVIL) 600 MG tablet, Take 600 mg by mouth every 6 (six) hours as needed., Disp: , Rfl:    oxyCODONE (ROXICODONE) 5 MG immediate release tablet, Take 1 tablet (5 mg total) by mouth every 6 (six) hours as needed for severe pain (pain score 7-10)., Disp: 15 tablet, Rfl: 0   sertraline (ZOLOFT) 50 MG tablet, Take 50 mg by mouth daily., Disp: , Rfl:    tamsulosin (FLOMAX) 0.4 MG CAPS capsule, Take 1 capsule (0.4 mg total) by mouth daily after supper., Disp: 30 capsule, Rfl: 0  Observations/Objective: Patient is well-developed, well-nourished in no acute distress.  Resting comfortably  at home.  Head is normocephalic, atraumatic.  No labored breathing.  Speech is clear and coherent with logical content.  Patient is alert and oriented at baseline.    Assessment and Plan: 1. Acute bacterial sinusitis  2. Moderate persistent asthma without complication  Increase fluids, humidifier at night, continue use of inhaler, UC if sx worsen.   Follow Up Instructions: I discussed the assessment and treatment plan with the patient. The patient was provided an opportunity to ask questions and all were answered. The patient agreed with the plan and demonstrated an understanding of the instructions.  A copy of instructions were sent to the patient via MyChart unless otherwise noted below.     The patient was advised to call back or seek an in-person evaluation if the symptoms worsen or if the condition fails to improve as anticipated.    Georgana Curio, FNP

## 2023-04-19 NOTE — Patient Instructions (Signed)

## 2023-04-21 ENCOUNTER — Ambulatory Visit: Payer: Medicaid Other | Admitting: Urology

## 2023-04-25 ENCOUNTER — Ambulatory Visit (HOSPITAL_BASED_OUTPATIENT_CLINIC_OR_DEPARTMENT_OTHER)
Admission: RE | Admit: 2023-04-25 | Discharge: 2023-04-25 | Disposition: A | Discharge status: Home / Self Care | Payer: Medicaid Other | Source: Ambulatory Visit | Attending: Urology | Admitting: Urology

## 2023-04-25 DIAGNOSIS — N201 Calculus of ureter: Secondary | ICD-10-CM | POA: Diagnosis present

## 2023-04-28 ENCOUNTER — Ambulatory Visit: Payer: Medicaid Other | Admitting: Urology

## 2023-04-28 ENCOUNTER — Encounter: Payer: Self-pay | Admitting: Urology

## 2023-04-28 VITALS — BP 124/83 | HR 91 | Ht 62.0 in | Wt 220.0 lb

## 2023-04-28 DIAGNOSIS — N281 Cyst of kidney, acquired: Secondary | ICD-10-CM

## 2023-04-28 DIAGNOSIS — Z87442 Personal history of urinary calculi: Secondary | ICD-10-CM

## 2023-04-28 DIAGNOSIS — N200 Calculus of kidney: Secondary | ICD-10-CM

## 2023-04-28 LAB — MICROSCOPIC EXAMINATION

## 2023-04-28 LAB — URINALYSIS, ROUTINE W REFLEX MICROSCOPIC
Bilirubin, UA: NEGATIVE
Glucose, UA: NEGATIVE
Ketones, UA: NEGATIVE
Leukocytes,UA: NEGATIVE
Nitrite, UA: NEGATIVE
Specific Gravity, UA: 1.025 (ref 1.005–1.030)
Urobilinogen, Ur: 0.2 mg/dL (ref 0.2–1.0)
pH, UA: 6.5 (ref 5.0–7.5)

## 2023-04-28 NOTE — Progress Notes (Signed)
Assessment: 1. Nephrolithiasis   2. Renal cyst, right     Plan: I reviewed the results of the recent renal ultrasound.  I discussed these with the patient. The right renal cyst was evident on the CT study from  1/24 and 10/24 and did not show any worrisome features. Stone prevention discussed. Return to office in 6 months with renal U/S  Chief Complaint:  Chief Complaint  Patient presents with   Nephrolithiasis    History of Present Illness:  Wendy [name] is a 32 y.o. female who is seen for further evaluation of a left ureteral calculus. She had acute onset of left-sided flank and abdominal pain on 02/26/2023.  She did have some associated nausea with vomiting.  No fevers or chills.  No dysuria or gross hematuria. CT imaging from 02/26/2023 showed a 5 x 8 mm stone in the left proximal ureter with obstructive changes. Cr 0.71 WBC 11.0K Urine culture grew 50K group B strep agalactiae.  She has been on Omnicef x 7 days. She underwent left ESL on 03/10/2023. She did well following the procedure.  She passed some small fragments.  No flank pain.  No dysuria or gross hematuria. KUB from 03/26/2023 showed no obvious calcifications along the expected course of the left ureter. Stone analysis: 13% calcium oxalate monohydrate, 50% calcium oxalate dihydrate, 72% calcium phosphate carbonate. Renal ultrasound from 04/25/2023 showed no hydronephrosis, a 1.1 x 1.9 x 1.5 cm complex cystic structure in the lateral right kidney.  She returns today for follow-up.  She is doing well.  No flank pain.  No new urinary symptoms.  Portions of the above documentation were copied from a prior visit for review purposes only.   Past Medical History:  Past Medical History:  Diagnosis Date   Anxiety    Panic attacks     Past Surgical History:  Past Surgical History:  Procedure Laterality Date   EXTRACORPOREAL SHOCK WAVE LITHOTRIPSY Left 03/10/2023   Procedure: EXTRACORPOREAL SHOCK WAVE LITHOTRIPSY  (ESWL);  Surgeon: [name], MD;  Location: [facility];  Service: Urology;  Laterality: Left;   TONSILLECTOMY     WISDOM TOOTH EXTRACTION      Allergies:  Allergies  Allergen Reactions   Gabapentin     Other Reaction(s): Other (See Comments)  Severe Headaches   Latex Nausea And Vomiting, Swelling, Dermatitis, Hives and Rash    Per pt.  Reacted to J&JCovid vaccine told allergic to Latex  Other Reaction(s): GI Intolerance  Reacted to [employer]sen Covid vaccine   Meloxicam     Other Reaction(s): Arthralgias, Arthralgias (intolerance), Fatique, Malaise (intolerance), Myalgias, Myalgias (intolerance)  Worsening Pain   Penicillins Other (See Comments) and Anaphylaxis    Family hx  Reaction occurred in early childhood  Her mother told her of the reaction  Reaction occurred in early childhood, Her mother told her of the reaction   Sulfa Antibiotics Hives   Metformin Diarrhea    Other Reaction(s): GI Intolerance   Prednisone Diarrhea and Nausea And Vomiting    Other Reaction(s): Diarrhea, GI Intolerance   Silver Rash    Pain and bleeding   Topiramate Rash    Family History:  No family history on file.  Social History:  Social History   Tobacco Use   Smoking status: Never   Smokeless tobacco: Never  Substance Use Topics   Alcohol use: Yes    Comment: Occasional   Drug use: No    ROS: Constitutional:  Negative for fever,  chills, weight loss CV: Negative for chest pain, previous MI, hypertension Respiratory:  Negative for shortness of breath, wheezing, sleep apnea, frequent cough GI:  Negative for nausea, vomiting, bloody stool, GERD  Physical exam: BP 124/83   Pulse 91   Ht 5\' 2"  (1.575 m)   Wt 220 lb (99.8 kg)   LMP 02/27/2021 (Exact Date)   BMI 40.24 kg/m  GENERAL APPEARANCE:  Well appearing, well developed, well nourished, NAD HEENT:  Atraumatic, normocephalic, oropharynx clear NECK:  Supple without lymphadenopathy or  thyromegaly ABDOMEN:  Soft, non-tender, no masses EXTREMITIES:  Moves all extremities well, without clubbing, cyanosis, or edema NEUROLOGIC:  Alert and oriented x 3, normal gait, CN II-XII grossly intact MENTAL STATUS:  appropriate BACK:  Non-tender to palpation, No CVAT SKIN:  Warm, dry, and intact  Results: U/A: 0-5 WBC, 0-2 RBC
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
## 2023-09-16 ENCOUNTER — Encounter (HOSPITAL_BASED_OUTPATIENT_CLINIC_OR_DEPARTMENT_OTHER): Payer: Self-pay | Admitting: *Deleted

## 2023-09-16 ENCOUNTER — Emergency Department (HOSPITAL_BASED_OUTPATIENT_CLINIC_OR_DEPARTMENT_OTHER)

## 2023-09-16 ENCOUNTER — Emergency Department (HOSPITAL_BASED_OUTPATIENT_CLINIC_OR_DEPARTMENT_OTHER)
Admission: EM | Admit: 2023-09-16 | Discharge: 2023-09-16 | Disposition: A | Discharge status: Home / Self Care | Attending: Emergency Medicine | Admitting: Emergency Medicine

## 2023-09-16 ENCOUNTER — Other Ambulatory Visit: Payer: Self-pay

## 2023-09-16 DIAGNOSIS — Y9389 Activity, other specified: Secondary | ICD-10-CM | POA: Diagnosis not present

## 2023-09-16 DIAGNOSIS — S92322A Displaced fracture of second metatarsal bone, left foot, initial encounter for closed fracture: Secondary | ICD-10-CM | POA: Insufficient documentation

## 2023-09-16 DIAGNOSIS — M7989 Other specified soft tissue disorders: Secondary | ICD-10-CM | POA: Insufficient documentation

## 2023-09-16 DIAGNOSIS — S92332A Displaced fracture of third metatarsal bone, left foot, initial encounter for closed fracture: Secondary | ICD-10-CM | POA: Diagnosis not present

## 2023-09-16 DIAGNOSIS — W208XXA Other cause of strike by thrown, projected or falling object, initial encounter: Secondary | ICD-10-CM | POA: Diagnosis not present

## 2023-09-16 DIAGNOSIS — S99922A Unspecified injury of left foot, initial encounter: Secondary | ICD-10-CM | POA: Diagnosis present

## 2023-09-16 DIAGNOSIS — S92342A Displaced fracture of fourth metatarsal bone, left foot, initial encounter for closed fracture: Secondary | ICD-10-CM | POA: Diagnosis not present

## 2023-09-16 DIAGNOSIS — Z91040 Latex allergy status: Secondary | ICD-10-CM | POA: Insufficient documentation

## 2023-09-16 DIAGNOSIS — S92902A Unspecified fracture of left foot, initial encounter for closed fracture: Secondary | ICD-10-CM

## 2023-09-16 MED ORDER — IBUPROFEN 400 MG PO TABS
600.0000 mg | ORAL_TABLET | Freq: Once | ORAL | Status: AC
  Administered 2023-09-16: 600 mg via ORAL
  Filled 2023-09-16: qty 1

## 2023-09-16 NOTE — ED Provider Notes (Signed)
 [place] [facility] Provider Note   CSN: [id number] Arrival date & time: 09/16/23  1748     History  Chief Complaint  Patient presents with   Foot Injury    Wendy [name] is a 33 y.o. female.  Patient is a 33 year old female who presents with left foot pain.  She has had a previous reported fracture in that foot.  She said she was moving some furniture in a dresser dropped on the foot.  This happened just prior to arrival.  She denies any other injuries.       Home Medications Prior to Admission medications   Medication Sig Start Date End Date Taking? Authorizing Provider  amLODipine (NORVASC) 2.5 MG tablet Take 2.5 mg by mouth daily.    [provider]  benzonatate  (TESSALON ) 200 MG capsule Take 1 capsule (200 mg total) by mouth 2 (two) times daily as needed for cough. 04/19/23   Blair, Diane W, FNP  ibuprofen  (ADVIL ) 600 MG tablet Take 600 mg by mouth every 6 (six) hours as needed.    [provider]  oxyCODONE  (ROXICODONE ) 5 MG immediate release tablet Take 1 tablet (5 mg total) by mouth every 6 (six) hours as needed for severe pain (pain score 7-10). 03/05/23   Stoneking, Ponce Brisker., MD  sertraline (ZOLOFT) 50 MG tablet Take 50 mg by mouth daily.    [provider]  tamsulosin  (FLOMAX ) 0.4 MG CAPS capsule Take 1 capsule (0.4 mg total) by mouth daily after supper. 02/27/23   Albertus Hughs, DO      Allergies    Gabapentin, Latex, Meloxicam , Penicillins, Sulfa antibiotics, Metformin, Prednisone, Silver, and Topiramate    Review of Systems   Review of Systems  Constitutional:  Negative for fever.  Gastrointestinal:  Negative for nausea and vomiting.  Musculoskeletal:  Positive for arthralgias and joint swelling. Negative for back pain and neck pain.  Skin:  Negative for wound.  Neurological:  Negative for weakness, numbness and headaches.    Physical Exam Updated Vital Signs BP (!) 147/98   Pulse 78    Temp 98.4 F (36.9 C) (Oral)   Resp 18   LMP 02/27/2021 (Exact Date)   SpO2 96%  Physical Exam Constitutional:      Appearance: She is well-developed.  HENT:     Head: Normocephalic and atraumatic.  Cardiovascular:     Rate and Rhythm: Normal rate.  Pulmonary:     Effort: Pulmonary effort is normal.  Musculoskeletal:        General: Tenderness present.     Cervical back: Normal range of motion and neck supple.     Comments: There is some mild swelling and ecchymosis over the mid dorsal aspect of the left foot.  There is generalized tenderness to this area.  She has limited movement of her toes.  She says this is resulting from her prior injury but usually she can move them a little bit better than she does now.  She also has some numbness to her toes.  She has good perfusion distally.  Capillary refill is less than 2.  Pedal pulses are intact.  There is no open wounds.  No pain to the ankle.  Skin:    General: Skin is warm and dry.  Neurological:     Mental Status: She is alert and oriented to person, place, and time.     ED Results / Procedures / Treatments   Labs (all labs ordered are listed,  but only abnormal results are displayed) Labs Reviewed - No data to display  EKG None  Radiology DG Foot Complete Left Result Date: 09/16/2023 CLINICAL DATA:  dresser fell on foot EXAM: LEFT FOOT - COMPLETE 3+ VIEW COMPARISON:  None Available. FINDINGS: Question vague transverse lucencies along the base of the second third and fourth digit metatarsals. No dislocation. There is no evidence of arthropathy or other focal bone abnormality. Mild dorsal subcutaneus soft tissue edema IMPRESSION: Question vague transverse lucencies along the base of the second third and fourth digit metatarsals. Correlate with point tenderness to palpation for acute underlying nondisplaced fractures. Electronically Signed   By: Morgane  Naveau M.D.   On: 09/16/2023 18:31    Procedures Procedures     Medications Ordered in ED Medications  ibuprofen  (ADVIL ) tablet 600 mg (has no administration in time range)    ED Course/ Medical Decision Making/ A&P                                 Medical Decision Making Problems Addressed: Closed fracture of left foot, initial encounter: acute illness or injury  Amount and/or Complexity of Data Reviewed External Data Reviewed: notes. Radiology: ordered and independent interpretation performed. Decision-making details documented in ED Course.  Risk OTC drugs.   Patient presents with pain and swelling after injury to her left foot.  X-rays were obtained.  There are some questionable subtle fractures of the 2nd, 3rd and 4th distal metatarsals.  This was reviewed by me and interpreted by the radiologist.  Will place her in a cam walker.  Gave her crutches.  Advised her to be nonweightbearing until she is followed up with the orthopedist.  Will refer her for orthopedic follow-up.  She was advised on ice and elevation.  She denies the need for any opioid pain medication.  Will take ibuprofen  and Tylenol .  She has ibuprofen  at home that she takes.  She was discharged home in good condition.  Return precautions were given.  Final Clinical Impression(s) / ED Diagnoses Final diagnoses:  Closed fracture of left foot, initial encounter    Rx / DC Orders ED Discharge Orders     None         Hershel Los, MD 09/16/23 1857

## 2023-09-16 NOTE — ED Triage Notes (Signed)
 Pt has left foot pain since a dresser fell on it.  Pt has bruising and swelling to left foot.

## 2023-09-16 NOTE — ED Notes (Signed)
D/c paperwork reviewed with pt, including follow up care.  All questions and/or concerns addressed at time of d/c.  No further needs expressed. . Pt verbalized understanding, Wheeled by ED staff to ED exit, NAD.   

## 2023-10-15 ENCOUNTER — Ambulatory Visit: Admitting: Physician Assistant

## 2023-10-15 ENCOUNTER — Encounter: Payer: Self-pay | Admitting: Physician Assistant

## 2023-10-15 VITALS — BP 124/85 | HR 81

## 2023-10-15 DIAGNOSIS — D229 Melanocytic nevi, unspecified: Secondary | ICD-10-CM

## 2023-10-15 DIAGNOSIS — L814 Other melanin hyperpigmentation: Secondary | ICD-10-CM | POA: Diagnosis not present

## 2023-10-15 DIAGNOSIS — W908XXA Exposure to other nonionizing radiation, initial encounter: Secondary | ICD-10-CM

## 2023-10-15 DIAGNOSIS — L219 Seborrheic dermatitis, unspecified: Secondary | ICD-10-CM

## 2023-10-15 DIAGNOSIS — Z1283 Encounter for screening for malignant neoplasm of skin: Secondary | ICD-10-CM | POA: Diagnosis not present

## 2023-10-15 DIAGNOSIS — L578 Other skin changes due to chronic exposure to nonionizing radiation: Secondary | ICD-10-CM

## 2023-10-15 DIAGNOSIS — D1801 Hemangioma of skin and subcutaneous tissue: Secondary | ICD-10-CM

## 2023-10-15 MED ORDER — HYDROCORTISONE 2.5 % EX CREA: TOPICAL_CREAM | Freq: Two times a day (BID) | CUTANEOUS | 3 refills | Status: AC | PRN

## 2023-10-15 NOTE — Progress Notes (Signed)
 New Patient Visit   Subjective  Wendy [name] is a 33 y.o. female who presents for the following: Skin Cancer Screening and Full Body Skin Exam  The patient presents for Total-Body Skin Exam (TBSE) for skin cancer screening and mole check. The patient has spots, moles and lesions to be evaluated, some may be new or changing and the patient may have concern these could be cancer.  Pt has no hx of skin cancer nor family hx of MM Pt has a mole on her back for several years she'd like evaluated.  Has had a mole removed once but pathology was normal.   Other concern: dry, flaky rash on face. Using ketoconazole shampoo prescribed by PCP.   The following portions of the chart were reviewed this encounter and updated as appropriate: medications, allergies, medical history  Review of Systems:  No other skin or systemic complaints except as noted in HPI or Assessment and Plan.  Objective  Well appearing patient in no apparent distress; mood and affect are within normal limits.  A full examination was performed including scalp, head, eyes, ears, nose, lips, neck, chest, axillae, abdomen, back, buttocks, bilateral upper extremities, bilateral lower extremities, hands, feet, fingers, toes, fingernails, and toenails. All findings within normal limits unless otherwise noted below.   Relevant physical exam findings are noted in the Assessment and Plan.    Assessment & Plan   SKIN CANCER SCREENING PERFORMED TODAY.  ACTINIC DAMAGE - Chronic condition, secondary to cumulative UV/sun exposure - diffuse scaly erythematous macules with underlying dyspigmentation - Recommend daily broad spectrum sunscreen SPF 30+ to sun-exposed areas, reapply every 2 hours as needed.  - Staying in the shade or wearing long sleeves, sun glasses (UVA+UVB protection) and wide brim hats (4-inch brim around the entire circumference of the hat) are also recommended for sun protection.  - Call for new or changing  lesions.  MELANOCYTIC NEVI - Tan-brown and/or pink-flesh-colored symmetric macules and papules - Benign appearing on exam today - Observation - Call clinic for new or changing moles - Recommend daily use of broad spectrum spf 30+ sunscreen to sun-exposed areas.   LENTIGINES Exam: scattered tan macules Due to sun exposure Treatment Plan: Benign-appearing, observe. Recommend daily broad spectrum sunscreen SPF 30+ to sun-exposed areas, reapply every 2 hours as needed.  Call for any changes   CHERRY ANGIOMAS Exam: red papule(s) Discussed benign nature. Recommend observation. Call for changes.    SEBORRHEIC DERMATITIS Exam: dry flakes and minimal redness face and scalp.   Seborrheic Dermatitis is a chronic persistent rash characterized by pinkness and scaling most commonly of the mid face but also can occur on the scalp (dandruff), ears; mid chest, mid back and groin.  It tends to be exacerbated by stress and cooler weather.  People who have neurologic disease may experience new onset or exacerbation of existing seborrheic dermatitis.  The condition is not curable but treatable and can be controlled.  Treatment Plan: - Continue to use Ketoconazole twice a week  - Prescribe hydrocortisone cream 2.5 % to apply to affected areas 2-3x a week.     ACTINIC SKIN DAMAGE   MULTIPLE BENIGN NEVI   LENTIGINES   CHERRY ANGIOMA   SEBORRHEIC DERMATITIS    Return if symptoms worsen or fail to improve.  I, Wilson Hasten, CMA, am acting as scribe for [employer], PA-C.   Documentation: I have reviewed the above documentation for accuracy and completeness, and I agree with the above.  Wendy Ebel K, PA-C

## 2023-10-15 NOTE — Patient Instructions (Signed)

## 2023-10-22 ENCOUNTER — Ambulatory Visit: Admitting: Dermatology

## 2023-11-03 ENCOUNTER — Encounter: Payer: Self-pay | Admitting: Urology

## 2023-11-03 ENCOUNTER — Ambulatory Visit: Payer: Medicaid Other | Admitting: Urology

## 2023-11-03 VITALS — BP 132/82 | HR 75 | Ht 62.0 in | Wt 204.0 lb

## 2023-11-03 DIAGNOSIS — N200 Calculus of kidney: Secondary | ICD-10-CM | POA: Diagnosis not present

## 2023-11-03 DIAGNOSIS — N281 Cyst of kidney, acquired: Secondary | ICD-10-CM

## 2023-11-03 LAB — URINALYSIS, ROUTINE W REFLEX MICROSCOPIC
Bilirubin, UA: NEGATIVE
Glucose, UA: NEGATIVE
Ketones, UA: NEGATIVE
Leukocytes,UA: NEGATIVE
Nitrite, UA: NEGATIVE
Protein,UA: NEGATIVE
Specific Gravity, UA: 1.02 (ref 1.005–1.030)
Urobilinogen, Ur: 0.2 mg/dL (ref 0.2–1.0)
pH, UA: 7 (ref 5.0–7.5)

## 2023-11-03 LAB — MICROSCOPIC EXAMINATION

## 2023-11-03 NOTE — Progress Notes (Signed)
 Assessment: 1. Nephrolithiasis   2. Renal cyst, right     Plan: Schedule for renal ultrasound for follow-up of the right renal cyst. Continue stone prevention Return to office in 6 months   Chief Complaint:  Chief Complaint  Patient presents with   Nephrolithiasis    History of Present Illness:  Wendy [name] is a 33 y.o. female who is seen for further evaluation of a left ureteral calculus. She had acute onset of left-sided flank and abdominal pain on 02/26/2023.  She did have some associated nausea with vomiting.  No fevers or chills.  No dysuria or gross hematuria. CT imaging from 02/26/2023 showed a 5 x 8 mm stone in the left proximal ureter with obstructive changes. Cr 0.71 WBC 11.0K Urine culture grew 50K group B strep agalactiae.  She has been on Omnicef  x 7 days. She underwent left ESL on 03/10/2023. She did well following the procedure.  She passed some small fragments.  No flank pain.  No dysuria or gross hematuria. KUB from 03/26/2023 showed no obvious calcifications along the expected course of the left ureter. Stone analysis: 13% calcium oxalate monohydrate, 50% calcium oxalate dihydrate, 72% calcium phosphate carbonate. Renal ultrasound from 04/25/2023 showed no hydronephrosis, a 1.1 x 1.9 x 1.5 cm complex cystic structure in the lateral right kidney.  She returns today for follow-up.  She has had no stone symptoms in the past 6 months.  No dysuria or gross hematuria.  Portions of the above documentation were copied from a prior visit for review purposes only.   Past Medical History:  Past Medical History:  Diagnosis Date   Anxiety    Panic attacks     Past Surgical History:  Past Surgical History:  Procedure Laterality Date   EXTRACORPOREAL SHOCK WAVE LITHOTRIPSY Left 03/10/2023   Procedure: EXTRACORPOREAL SHOCK WAVE LITHOTRIPSY (ESWL);  Surgeon: Scarlet Curly, MD;  [facility];  Service: Urology;  Laterality: Left;    TONSILLECTOMY     WISDOM TOOTH EXTRACTION      Allergies:  Allergies  Allergen Reactions   Gabapentin     Other Reaction(s): Other (See Comments)  Severe Headaches   Latex Nausea And Vomiting, Swelling, Dermatitis, Hives and Rash    Per pt.  Reacted to J&JCovid vaccine told allergic to Latex  Other Reaction(s): GI Intolerance  Reacted to [employer] vaccine   Meloxicam      Other Reaction(s): Arthralgias, Arthralgias (intolerance), Fatique, Malaise (intolerance), Myalgias, Myalgias (intolerance)  Worsening Pain   Penicillins Other (See Comments) and Anaphylaxis    Family hx  Reaction occurred in early childhood  Her mother told her of the reaction  Reaction occurred in early childhood, Her mother told her of the reaction   Sulfa Antibiotics Hives   Metformin Diarrhea    Other Reaction(s): GI Intolerance   Prednisone Diarrhea and Nausea And Vomiting    Other Reaction(s): Diarrhea, GI Intolerance   Silver Rash    Pain and bleeding   Topiramate Rash    Family History:  No family history on file.  Social History:  Social History   Tobacco Use   Smoking status: Never   Smokeless tobacco: Never  Substance Use Topics   Alcohol use: Yes    Comment: Occasional   Drug use: No    ROS: Constitutional:  Negative for fever, chills, weight loss CV: Negative for chest pain, previous MI, hypertension Respiratory:  Negative for shortness of breath, wheezing, sleep apnea, frequent cough GI:  Negative for nausea, vomiting, bloody stool, GERD  Physical exam: BP 132/82   Pulse 75   Ht 5' 2 (1.575 m)   Wt 204 lb (92.5 kg)   LMP 02/27/2021 (Exact Date)   BMI 37.31 kg/m  GENERAL APPEARANCE:  Well appearing, well developed, well nourished, NAD HEENT:  Atraumatic, normocephalic, oropharynx clear NECK:  Supple without lymphadenopathy or thyromegaly ABDOMEN:  Soft, non-tender, no masses EXTREMITIES:  Moves all extremities well, without clubbing, cyanosis, or  edema NEUROLOGIC:  Alert and oriented x 3, normal gait, CN II-XII grossly intact MENTAL STATUS:  appropriate BACK:  Non-tender to palpation, No CVAT SKIN:  Warm, dry, and intact  Results: U/A: 0-5 WBCs, 0-2 RBCs

## 2023-11-27 ENCOUNTER — Ambulatory Visit (HOSPITAL_BASED_OUTPATIENT_CLINIC_OR_DEPARTMENT_OTHER)
Admission: RE | Admit: 2023-11-27 | Discharge: 2023-11-27 | Disposition: A | Discharge status: Home / Self Care | Source: Ambulatory Visit | Attending: Urology | Admitting: Urology

## 2023-11-27 DIAGNOSIS — N281 Cyst of kidney, acquired: Secondary | ICD-10-CM | POA: Diagnosis present

## 2023-12-01 ENCOUNTER — Ambulatory Visit: Payer: Self-pay | Admitting: Urology

## 2023-12-26 ENCOUNTER — Other Ambulatory Visit: Payer: Self-pay | Admitting: Medical Genetics

## 2023-12-30 ENCOUNTER — Other Ambulatory Visit

## 2023-12-30 DIAGNOSIS — Z006 Encounter for examination for normal comparison and control in clinical research program: Secondary | ICD-10-CM

## 2024-01-09 LAB — GENECONNECT MOLECULAR SCREEN: Genetic Analysis Overall Interpretation: NEGATIVE

## 2024-04-28 ENCOUNTER — Ambulatory Visit: Admitting: Urology

## 2024-05-04 ENCOUNTER — Ambulatory Visit: Admitting: Urology

## 2024-05-11 ENCOUNTER — Telehealth: Admitting: Physician Assistant

## 2024-05-11 DIAGNOSIS — J111 Influenza due to unidentified influenza virus with other respiratory manifestations: Secondary | ICD-10-CM

## 2024-05-11 DIAGNOSIS — R051 Acute cough: Secondary | ICD-10-CM

## 2024-05-11 DIAGNOSIS — R52 Pain, unspecified: Secondary | ICD-10-CM

## 2024-05-11 MED ORDER — BENZONATATE 100 MG PO CAPS: 100.0000 mg | ORAL_CAPSULE | Freq: Three times a day (TID) | ORAL | 0 refills | Status: AC | PRN

## 2024-05-11 MED ORDER — OSELTAMIVIR PHOSPHATE 75 MG PO CAPS: 75.0000 mg | ORAL_CAPSULE | Freq: Two times a day (BID) | ORAL | 0 refills | Status: AC

## 2024-05-11 NOTE — Progress Notes (Signed)
 " Virtual Visit Consent   Wendy [name], you are scheduled for a virtual visit with a [facility] provider today. Just as with appointments in the office, your consent must be obtained to participate. Your consent will be active for this visit and any virtual visit you may have with one of our providers in the next 365 days. If you have a MyChart account, a copy of this consent can be sent to you electronically.  As this is a virtual visit, video technology does not allow for your provider to perform a traditional examination. This may limit your provider's ability to fully assess your condition. If your provider identifies any concerns that need to be evaluated in person or the need to arrange testing (such as labs, EKG, etc.), we will make arrangements to do so. Although advances in technology are sophisticated, we cannot ensure that it will always work on either your end or our end. If the connection with a video visit is poor, the visit may have to be switched to a telephone visit. With either a video or telephone visit, we are not always able to ensure that we have a secure connection.  By engaging in this virtual visit, you consent to the provision of healthcare and authorize for your insurance to be billed (if applicable) for the services provided during this visit. Depending on your insurance coverage, you may receive a charge related to this service.  I need to obtain your verbal consent now. Are you willing to proceed with your visit today? Wendy [name] has provided verbal consent on 05/11/2024 for a virtual visit (video or telephone). Temiloluwa Laredo, PA-C  Date: 05/11/2024 8:10 AM   Virtual Visit via Video Note   I, Wendy [name], connected with  Wendy [name]  ([id number], Feb 24, 1991) on 05/11/2024 at  8:00 AM EST by a video-enabled telemedicine application and verified that I am speaking with the correct person using two identifiers.  Location: Patient: Virtual Visit Location  Patient: Home Provider: Virtual Visit Location Provider: Home Office   I discussed the limitations of evaluation and management by telemedicine and the availability of in person appointments. The patient expressed understanding and agreed to proceed.    History of Present Illness: Wendy [name] is a 33 y.o. who identifies as a female who was assigned female at birth, and is being seen today for flu symptoms.  HPI: Patient reports she started feeling sick yesterday.  Her mom tested positive for the flu.  Patient reports generalized bodyaches, feeling clammy, dry cough, nausea and diarrhea.  She denies any episodes of vomiting.  She is able to tolerate p.o. intake.  Patient has not had the flu shot this year.  Patient denies chest pain, shortness of breath, abdominal pain.  She has tried ibuprofen  for her symptoms which has been somewhat helpful.  She is requiring a work note.    Problems:  Patient Active Problem List   Diagnosis Date Noted   Paresthesia of left foot 05/31/2021   Foot contusion 01/30/2021    Allergies: Allergies[1] Medications: Current Medications[2]  Observations/Objective: Patient is well-developed, well-nourished in no acute distress.  Resting comfortably  at home.  Head is normocephalic, atraumatic.  No labored breathing.  Speech is clear and coherent with logical content.  Patient is alert and oriented at baseline.    Assessment and Plan: 1. Influenza (Primary) - oseltamivir  (TAMIFLU ) 75 MG capsule; Take 1 capsule (75 mg total) by mouth 2 (two) times daily for 5 days.  Dispense: 10 capsule; Refill: 0  2. Acute cough - benzonatate  (TESSALON ) 100 MG capsule; Take 1 capsule (100 mg total) by mouth 3 (three) times daily as needed for up to 7 days for cough.  Dispense: 20 capsule; Refill: 0  3. Body aches  Get rest and adequate sleep  Drink plenty of water, broth, and other clear fluids to stay hydrated.  Use a cool-mist humidifier or take steamy showers to  relieve congestion.  Elevate the head of the bed to help with post nasal drainage Sip warm liquids, gargle with salt water, use lozenges, or suck on hard candy.  Use over-the-counter medications like acetaminophen  (Tylenol ) or ibuprofen  (Advil , Motrin ) as needed for fever and pain Honey cough drops can help alleviate cough symptoms.  Use saline nasal sprays or washes.  Over the counter mucinex, max strength ( blue and white box) to help loosen sinus congestion.  Please to the emergency room if any new or worsening symptoms  Please seek an in-person evaluation if the symptoms worsen or if the condition fails to improve as anticipated.  PCP follow-up in 5-7 days   Follow Up Instructions: I discussed the assessment and treatment plan with the patient. The patient was provided an opportunity to ask questions and all were answered. The patient agreed with the plan and demonstrated an understanding of the instructions.  A copy of instructions were sent to the patient via MyChart unless otherwise noted below.   The patient was advised to call back or seek an in-person evaluation if the symptoms worsen or if the condition fails to improve as anticipated.    Wendy Pasko, PA-C    [1]  Allergies Allergen Reactions   Gabapentin     Other Reaction(s): Other (See Comments)  Severe Headaches   Latex Nausea And Vomiting, Swelling, Dermatitis, Hives and Rash    Per pt.  Reacted to J&JCovid vaccine told allergic to Latex  Other Reaction(s): GI Intolerance  Reacted to [employer] vaccine   Meloxicam      Other Reaction(s): Arthralgias, Arthralgias (intolerance), Fatique, Malaise (intolerance), Myalgias, Myalgias (intolerance)  Worsening Pain   Penicillins Other (See Comments) and Anaphylaxis    Family hx  Reaction occurred in early childhood  Her mother told her of the reaction  Reaction occurred in early childhood, Her mother told her of the reaction   Sulfa Antibiotics Hives    Metformin Diarrhea    Other Reaction(s): GI Intolerance   Prednisone Diarrhea and Nausea And Vomiting    Other Reaction(s): Diarrhea, GI Intolerance   Silver Rash    Pain and bleeding   Topiramate Rash  [2]  Current Outpatient Medications:    amLODipine (NORVASC) 2.5 MG tablet, Take 2.5 mg by mouth daily., Disp: , Rfl:    benzonatate  (TESSALON ) 100 MG capsule, Take 1 capsule (100 mg total) by mouth 3 (three) times daily as needed for up to 7 days for cough., Disp: 20 capsule, Rfl: 0   hydrocortisone  2.5 % cream, Apply topically 2 (two) times daily as needed (Rash)., Disp: 30 g, Rfl: 3   ibuprofen  (ADVIL ) 600 MG tablet, Take 600 mg by mouth every 6 (six) hours as needed., Disp: , Rfl:    ketoconazole (NIZORAL) 2 % shampoo, Apply topically 2 (two) times a week., Disp: , Rfl:    oseltamivir  (TAMIFLU ) 75 MG capsule, Take 1 capsule (75 mg total) by mouth 2 (two) times daily for 5 days., Disp: 10 capsule, Rfl: 0   oxyCODONE  (ROXICODONE ) 5 MG immediate release  tablet, Take 1 tablet (5 mg total) by mouth every 6 (six) hours as needed for severe pain (pain score 7-10). (Patient not taking: Reported on 11/03/2023), Disp: 15 tablet, Rfl: 0   sertraline (ZOLOFT) 50 MG tablet, Take 50 mg by mouth daily., Disp: , Rfl:    tamsulosin  (FLOMAX ) 0.4 MG CAPS capsule, Take 1 capsule (0.4 mg total) by mouth daily after supper., Disp: 30 capsule, Rfl: 0   VENTOLIN  HFA 108 (90 Base) MCG/ACT inhaler, Inhale 2 puffs into the lungs every 6 (six) hours as needed., Disp: , Rfl:   "

## 2024-05-11 NOTE — Patient Instructions (Signed)
 " Wendy [name], thank you for joining Viola Kinnick, PA-C for today's virtual visit.  While this provider is not your primary care provider (PCP), if your PCP is located in our provider database this encounter information will be shared with them immediately following your visit.   A [facility] MyChart account gives you access to today's visit and all your visits, tests, and labs performed at [facility]  click here if you don't have a [facility] MyChart account or go to [email or web address]  Consent: (Patient) Wendy [name] provided verbal consent for this virtual visit at the beginning of the encounter.  Current Medications:  Current Outpatient Medications:    amLODipine (NORVASC) 2.5 MG tablet, Take 2.5 mg by mouth daily., Disp: , Rfl:    benzonatate  (TESSALON ) 100 MG capsule, Take 1 capsule (100 mg total) by mouth 3 (three) times daily as needed for up to 7 days for cough., Disp: 20 capsule, Rfl: 0   hydrocortisone  2.5 % cream, Apply topically 2 (two) times daily as needed (Rash)., Disp: 30 g, Rfl: 3   ibuprofen  (ADVIL ) 600 MG tablet, Take 600 mg by mouth every 6 (six) hours as needed., Disp: , Rfl:    ketoconazole (NIZORAL) 2 % shampoo, Apply topically 2 (two) times a week., Disp: , Rfl:    oseltamivir  (TAMIFLU ) 75 MG capsule, Take 1 capsule (75 mg total) by mouth 2 (two) times daily for 5 days., Disp: 10 capsule, Rfl: 0   oxyCODONE  (ROXICODONE ) 5 MG immediate release tablet, Take 1 tablet (5 mg total) by mouth every 6 (six) hours as needed for severe pain (pain score 7-10). (Patient not taking: Reported on 11/03/2023), Disp: 15 tablet, Rfl: 0   sertraline (ZOLOFT) 50 MG tablet, Take 50 mg by mouth daily., Disp: , Rfl:    tamsulosin  (FLOMAX ) 0.4 MG CAPS capsule, Take 1 capsule (0.4 mg total) by mouth daily after supper., Disp: 30 capsule, Rfl: 0   VENTOLIN  HFA 108 (90 Base) MCG/ACT inhaler, Inhale 2 puffs into the lungs every 6 (six) hours as needed., Disp: , Rfl:     Medications ordered in this encounter:  Meds ordered this encounter  Medications   oseltamivir  (TAMIFLU ) 75 MG capsule    Sig: Take 1 capsule (75 mg total) by mouth 2 (two) times daily for 5 days.    Dispense:  10 capsule    Refill:  0    Supervising Provider:   LAMPTEY, PHILIP O [[id number]]   benzonatate  (TESSALON ) 100 MG capsule    Sig: Take 1 capsule (100 mg total) by mouth 3 (three) times daily as needed for up to 7 days for cough.    Dispense:  20 capsule    Refill:  0    Supervising Provider:   BLAISE ALEENE KIDD [[id number]]     *If you need refills on other medications prior to your next appointment, please contact your pharmacy*  Follow-Up: Call back or seek an in-person evaluation if the symptoms worsen or if the condition fails to improve as anticipated.  [facility] [phone]  Other Instfluructions    If you have been instructed to have an in-person evaluation today at a local Urgent Care facility, please use the link below. It will take you to a list of all of our available [facility], including address, phone number and hours of operation. Please do not delay care.  [facility]  If you or a family member do not have a  primary care provider, use the link below to schedule a visit and establish care. When you choose a [facility] primary care physician or advanced practice provider, you gain a long-term partner in health. Find a Primary Care Provider  Learn more about [facility] in-office and virtual care options: [facility] - Get Care Now  "

## 2024-06-09 ENCOUNTER — Ambulatory Visit: Admitting: Urology
